# Patient Record
Sex: Male | Born: 1960 | Race: White | Hispanic: No | Marital: Single | Smoking: Never smoker
Health system: Southern US, Community
[De-identification: ages and names within clinical notes are randomized; demographics above are authoritative.]

## PROBLEM LIST (undated history)

## (undated) DIAGNOSIS — D45 Polycythemia vera: Secondary | ICD-10-CM

## (undated) DIAGNOSIS — G459 Transient cerebral ischemic attack, unspecified: Secondary | ICD-10-CM

## (undated) DIAGNOSIS — I10 Essential (primary) hypertension: Secondary | ICD-10-CM

## (undated) DIAGNOSIS — F32A Depression, unspecified: Secondary | ICD-10-CM

## (undated) DIAGNOSIS — I639 Cerebral infarction, unspecified: Secondary | ICD-10-CM

## (undated) DIAGNOSIS — M79606 Pain in leg, unspecified: Secondary | ICD-10-CM

## (undated) DIAGNOSIS — M21379 Foot drop, unspecified foot: Secondary | ICD-10-CM

## (undated) DIAGNOSIS — D689 Coagulation defect, unspecified: Secondary | ICD-10-CM

## (undated) DIAGNOSIS — E291 Testicular hypofunction: Secondary | ICD-10-CM

## (undated) DIAGNOSIS — K429 Umbilical hernia without obstruction or gangrene: Secondary | ICD-10-CM

## (undated) DIAGNOSIS — J111 Influenza due to unidentified influenza virus with other respiratory manifestations: Secondary | ICD-10-CM

## (undated) DIAGNOSIS — E785 Hyperlipidemia, unspecified: Secondary | ICD-10-CM

## (undated) DIAGNOSIS — D649 Anemia, unspecified: Secondary | ICD-10-CM

## (undated) DIAGNOSIS — K4090 Unilateral inguinal hernia, without obstruction or gangrene, not specified as recurrent: Secondary | ICD-10-CM

## (undated) DIAGNOSIS — Z9289 Personal history of other medical treatment: Secondary | ICD-10-CM

## (undated) DIAGNOSIS — M792 Neuralgia and neuritis, unspecified: Secondary | ICD-10-CM

## (undated) DIAGNOSIS — F329 Major depressive disorder, single episode, unspecified: Secondary | ICD-10-CM

## (undated) DIAGNOSIS — L723 Sebaceous cyst: Secondary | ICD-10-CM

## (undated) DIAGNOSIS — T7840XA Allergy, unspecified, initial encounter: Secondary | ICD-10-CM

## (undated) HISTORY — DX: Anemia, unspecified: D64.9

## (undated) HISTORY — PX: SPLENECTOMY, TOTAL: SHX788

## (undated) HISTORY — DX: Unilateral inguinal hernia, without obstruction or gangrene, not specified as recurrent: K40.90

## (undated) HISTORY — DX: Foot drop, unspecified foot: M21.379

## (undated) HISTORY — PX: FRACTURE SURGERY: SHX138

## (undated) HISTORY — DX: Essential (primary) hypertension: I10

## (undated) HISTORY — DX: Sebaceous cyst: L72.3

## (undated) HISTORY — DX: Allergy, unspecified, initial encounter: T78.40XA

## (undated) HISTORY — PX: SKIN CANCER EXCISION: SHX779

## (undated) HISTORY — DX: Pain in leg, unspecified: M79.606

## (undated) HISTORY — PX: COLON SURGERY: SHX602

## (undated) HISTORY — PX: EXCISIONAL HEMORRHOIDECTOMY: SHX1541

## (undated) HISTORY — DX: Hyperlipidemia, unspecified: E78.5

## (undated) HISTORY — DX: Coagulation defect, unspecified: D68.9

## (undated) HISTORY — PX: BACK SURGERY: SHX140

## (undated) HISTORY — DX: Umbilical hernia without obstruction or gangrene: K42.9

## (undated) HISTORY — PX: INGUINAL HERNIA REPAIR: SUR1180

## (undated) HISTORY — DX: Testicular hypofunction: E29.1

## (undated) HISTORY — DX: Influenza due to unidentified influenza virus with other respiratory manifestations: J11.1

## (undated) HISTORY — PX: FEMUR SURGERY: SHX943

## (undated) HISTORY — PX: COCCYX FRACTURE SURGERY: SHX599

---

## 1994-12-04 ENCOUNTER — Emergency Department: Admit: 1994-12-04 | Payer: Self-pay | Source: Ambulatory Visit | Admitting: Emergency Medicine

## 2011-01-31 ENCOUNTER — Inpatient Hospital Stay
Admission: EM | Admit: 2011-01-31 | Disposition: A | Discharge status: Home / Self Care | Payer: Self-pay | Source: Emergency Department | Attending: Internal Medicine | Admitting: Internal Medicine

## 2011-01-31 LAB — CBC AND DIFFERENTIAL
Hematocrit: 56.2 % — ABNORMAL HIGH (ref 42.0–52.0)
Hgb: 18.7 g/dL — ABNORMAL HIGH (ref 13.0–17.0)
MCH: 27.1 pg — ABNORMAL LOW (ref 28.0–32.0)
MCHC: 33.3 g/dL (ref 32.0–36.0)
MCV: 81.6 fL (ref 80.0–100.0)
MPV: 10.1 fL (ref 9.4–12.3)
Platelets: 339 10*3/uL (ref 140–400)
RBC: 6.89 10*6/uL — ABNORMAL HIGH (ref 4.70–6.00)
RDW: 17 % — ABNORMAL HIGH (ref 12–15)
WBC: 10.07 10*3/uL (ref 3.50–10.80)

## 2011-01-31 LAB — COMPREHENSIVE METABOLIC PANEL
ALT: 19 U/L (ref 0–55)
AST (SGOT): 22 U/L (ref 5–34)
Albumin/Globulin Ratio: 1.2 (ref 0.9–2.2)
Albumin: 4.1 g/dL (ref 3.5–5.0)
Alkaline Phosphatase: 112 U/L (ref 40–150)
Anion Gap: 13 (ref 5.0–15.0)
BUN: 18 mg/dL (ref 9.0–21.0)
Bilirubin, Total: 0.9 mg/dL (ref 0.2–1.2)
CO2: 21 mEq/L — ABNORMAL LOW (ref 22–29)
Calcium: 9.1 mg/dL (ref 8.5–10.5)
Chloride: 108 mEq/L — ABNORMAL HIGH (ref 98–107)
Creatinine: 1 mg/dL (ref 0.7–1.3)
Globulin: 3.5 g/dL (ref 2.0–3.6)
Glucose: 114 mg/dL — ABNORMAL HIGH (ref 70–100)
Potassium: 3.9 mEq/L (ref 3.5–5.1)
Protein, Total: 7.6 g/dL (ref 6.0–8.3)
Sodium: 142 mEq/L (ref 136–145)

## 2011-01-31 LAB — TROPONIN I: Troponin I: <0.01 ng/mL (ref 0.00–0.09)

## 2011-01-31 LAB — MAN DIFF ONLY
Band Neutrophils Absolute: 0 10*3/uL (ref 0.00–1.00)
Band Neutrophils: 0 % (ref 0–9)
Basophils Absolute Manual: 0.1 10*3/uL (ref 0.00–0.20)
Basophils Manual: 1 % (ref 0–2)
Eosinophils Absolute Manual: 0.1 10*3/uL (ref 0.00–0.70)
Eosinophils Manual: 1 % (ref 0–5)
Lymphocytes Absolute Manual: 1.38 10*3/uL (ref 0.50–4.40)
Lymphocytes Manual: 14 % — ABNORMAL LOW (ref 15–41)
Monocytes Absolute: 0.98 10*3/uL (ref 0.00–1.20)
Monocytes Manual: 10 % (ref 1–11)
Neutrophils Absolute Manual: 7.28 10*3/uL (ref 1.80–8.10)
Nucleated RBC: 0 /100 WBC
Segmented Neutrophils: 74 % (ref 52–75)

## 2011-01-31 LAB — HEMOLYSIS INDEX: Hemolysis Index: 25 Index — ABNORMAL HIGH (ref 0–18)

## 2011-01-31 LAB — PT/INR
PT INR: 1 (ref 0.9–1.1)
PT: 13.2 s (ref 12.6–15.0)

## 2011-01-31 LAB — CELL MORPHOLOGY: Cell Morphology: NORMAL

## 2011-01-31 LAB — GFR: EGFR: >60

## 2011-01-31 LAB — APTT: PTT: 29 s (ref 23–37)

## 2011-02-01 LAB — CBC AND DIFFERENTIAL
Basophils Absolute Automated: 0.06 10*3/uL (ref 0.00–0.20)
Basophils Automated: 0 % (ref 0–2)
Eosinophils Absolute Automated: 0.05 10*3/uL (ref 0.00–0.70)
Eosinophils Automated: 0 % (ref 0–5)
Hematocrit: 52.3 % — ABNORMAL HIGH (ref 42.0–52.0)
Hgb: 17.3 g/dL — ABNORMAL HIGH (ref 13.0–17.0)
Immature Granulocytes Absolute: 0.03 10*3/uL
Immature Granulocytes: 0 % (ref 0–1)
Lymphocytes Absolute Automated: 1.22 10*3/uL (ref 0.50–4.40)
Lymphocytes Automated: 11 % — ABNORMAL LOW (ref 15–41)
MCH: 27.2 pg — ABNORMAL LOW (ref 28.0–32.0)
MCHC: 33.1 g/dL (ref 32.0–36.0)
MCV: 82.4 fL (ref 80.0–100.0)
MPV: 10.6 fL (ref 9.4–12.3)
Monocytes Absolute Automated: 0.86 10*3/uL (ref 0.00–1.20)
Monocytes: 8 % (ref 0–11)
Neutrophils Absolute: 8.94 10*3/uL — ABNORMAL HIGH (ref 1.80–8.10)
Neutrophils: 80 % — ABNORMAL HIGH (ref 52–75)
Nucleated RBC: 0 /100 WBC
Platelets: 300 10*3/uL (ref 140–400)
RBC: 6.35 10*6/uL — ABNORMAL HIGH (ref 4.70–6.00)
RDW: 16 % — ABNORMAL HIGH (ref 12–15)
WBC: 11.16 10*3/uL — ABNORMAL HIGH (ref 3.50–10.80)

## 2011-02-01 LAB — LIPID PANEL
Cholesterol / HDL Ratio: 4.9 Index
Cholesterol: 188 mg/dL (ref 0–199)
HDL: 38 mg/dL — ABNORMAL LOW (ref >40)
LDL Calculated: 133 mg/dL — ABNORMAL HIGH (ref 0–99)
Triglycerides: 83 mg/dL (ref 34–149)
VLDL Calculated: 17 mg/dL (ref 10–40)

## 2011-02-01 LAB — ECG 12-LEAD
Atrial Rate: 73 {beats}/min
P Axis: 65 degrees
P-R Interval: 186 ms
Q-T Interval: 380 ms
QRS Duration: 92 ms
QTC Calculation (Bezet): 418 ms
R Axis: -33 degrees
T Axis: 46 degrees
Ventricular Rate: 73 {beats}/min

## 2011-02-01 LAB — HOMOCYSTEINE, SERUM: HOMOCYSTEINE: 7.33 umol/L (ref 5.08–15.39)

## 2011-02-01 LAB — VITAMIN B12: Vitamin B-12: 707 pg/mL (ref 211–911)

## 2011-02-01 LAB — HEMOLYSIS INDEX: Hemolysis Index: 118 Index — ABNORMAL HIGH (ref 0–9)

## 2011-02-01 LAB — TSH: TSH: 1.13 u[IU]/mL (ref 0.35–4.94)

## 2011-02-01 LAB — SEDIMENTATION RATE: Sed Rate: 2 mm/Hr (ref 0–15)

## 2011-02-01 LAB — FOLATE: Folate: >24 ng/mL

## 2011-02-01 NOTE — Consults (Signed)
Timothy Brandt, SATTLER                                           MRN:          [id number]                                                          Account:      [id number]                                        Document ID:  [id number]                                                   Service Date: 02/01/2011                                                                                    Admit Date: 01/31/2011     Patient Location: [id number]  Patient Type: I     CONSULTING PHYSICIAN: Ilda Basset MD     REFERRING PHYSICIAN: Doree Albee Silis MD        CONSULTING SERVICES:  Neurology.     REASON FOR CONSULTATION:  Episodes of vertigo.     HISTORY OF PRESENT ILLNESS:  This is a 51 year old male with history of hypertension, hyperlipidemia,  recent weight loss history, presented to the emergency room with episode of  dizziness with a spinning sensation while he was having dinner yesterday  evening around 9:00 p.m.  The patient reported that around the dinnertime,  he suddenly felt uneasy, discomfort and lightheadedness with a spinning  sensation.  Then his head started hurting and then suddenly he felt that he  is not going to keep his food and he started throwing up, and he threw up  all his food that he ate.  He also mentioned that his symptoms did not go  away, and then EMS was called and he was brought here for further  evaluation.  At that time, his blood pressure was 195/118 and received  treatment for hypertension and is not on any medicine at home.  The patient  reported that he was told that he has a history of hypertension, but never  on medication.  He also found to have hyperlipidemia, but not on medicine  as well.  At the time of my evaluation, the patient at his baseline.  He  reported this symptom lasted almost 2 hours, did not return, and at his  baseline at this time, as mentioned.     PAST MEDICAL HISTORY:  Hypertension, hyperlipidemia.     PAST SURGICAL HISTORY:  Inguinal hernia  repair.     FAMILY  HISTORY:  Unremarkable except strong family history of heart disease and  hypertension, especially father's side.     ALLERGIES:                                                                                                           Page 1 of 3  JKWON, TREPTOW                                           MRN:          [id number]                                                          Account:      [id number]                                        Document ID:  [id number]                                                   Service Date: 02/01/2011                                                                                    Not allergic to any medications.     MEDICATIONS AT HOME:  Not on any medicine at home.     SOCIAL HISTORY:  He works in a Materials engineer, Development worker, international [employer].  Denies smoking cigarettes.   Very occasional alcohol.  No illicit drugs.     REVIEW OF SYSTEMS:  A 10-point review of systems unremarkable.  No chest pain, no shortness of  breath, no incontinence of bladder or bowel.  Otherwise unremarkable.     PHYSICAL EXAMINATION:  VITAL SIGNS:  Blood pressure 147/90, heart rate 75, respirations 18,  temperature 98.5, 95% room air.  GENERAL:  Pleasant male, well developed, well nourished, not in acute  distress.  HEENT:  Supple, no carotid bruit or thyromegaly.  HEART:  Regular rate and rhythm, no murmur, no gallop.  LUNGS:  Clear to auscultation, no rales, rhonchi.  ABDOMEN:  Soft, nontender, nondistended.  EXTREMITIES:  No clubbing, no cyanosis, no edema.  Pulses are present  bilaterally.  NEUROLOGIC:  Mental status:  Alert, oriented x3, appropriate, cooperative,  follows commands.  Speech is normal with good comprehension.  Attention and  concentration intact.  Memory also intact.  Cranial nerves:  Pupils are  3-mm, round, reactive bilaterally.  Extraocular movements all are intact.  No facial asymmetry.  Visual fields also intact bilaterally.  No nystagmus  bilaterally.  All other cranial  nerves are intact.  Motor examination:  Strength 5/5 upper and lower extremities bilaterally.  Normal muscle tone  and bulk of muscle.  Sensation intact to upper and lower extremities  bilaterally.  Coordination intact finger-to-nose, heel-to-shin.  Gait:  Stands easily, toe walking, heel walking, tandem walking.  Myotonic  reflexes 2+ symmetric throughout.  Toes are downgoing bilaterally.     LABORATORY AND DIAGNOSTIC DATA:  CBC:  White blood count 10.07, hemoglobin 18.7, platelets 339.  Basic  metabolic panel:  Sodium 142, potassium 3.9, BUN 18, creatinine 1.0,  glucose 114.  He had a head CT without contrast which did not show any  acute intracranial process at the time of admission.     IMPRESSION:  The patient is a 51 year old male who presented with resolved symptom of  vertigo, and now under workup for stroke, especially with                                                                                                            Page 2 of 3  KHAN, CHURA                                           MRN:          [id number]                                                          Account:      [id number]                                        Document ID:  [id number]                                                   Service Date: 02/01/2011  acute cerebellar stroke.     RECOMMENDATION:  1.  Continue aspirin 81 mg daily, and statin  2.  Workup with MRI of head as well as MRA headand neck  3.  Stroke labs with homocysteine, folate, B12, hemoglobin A1c, lipid panel,   TSH, sedimentation rate, and RPR with full list of stroke workup. 4. After MRI   of head will consider hypercoagulable state workup if MRI show stroke.   5. Recommend to havephysical and occupational therapy.   6. Continue Meclizinelow dose s he has been receiving with vertigo symptoms.     The patient also recommended to follow up with his primary team here.        The patient will be following up with neurology service.     Thank you for allowing me to participate in the care of this patient.                 D:  02/01/2011 13:55 PM by Dr. Ilda Basset, MD ([id number])  T:  02/01/2011 14:19 PM by [id number]        cc:                                                                                                           Page 3 of 3  Authenticated and Edited by Ilda Basset, MD ([id number]) On 02/01/11 5:55:54 PM

## 2011-02-02 DIAGNOSIS — R42 Dizziness and giddiness: Secondary | ICD-10-CM

## 2011-02-02 DIAGNOSIS — G458 Other transient cerebral ischemic attacks and related syndromes: Secondary | ICD-10-CM

## 2011-02-02 LAB — CBC AND DIFFERENTIAL
Basophils Absolute Automated: 0.06 10*3/uL (ref 0.00–0.20)
Basophils Automated: 1 % (ref 0–2)
Eosinophils Absolute Automated: 0.12 10*3/uL (ref 0.00–0.70)
Eosinophils Automated: 1 % (ref 0–5)
Hematocrit: 57.1 % — ABNORMAL HIGH (ref 42.0–52.0)
Hgb: 18.8 g/dL — ABNORMAL HIGH (ref 13.0–17.0)
Immature Granulocytes Absolute: 0.03 10*3/uL
Immature Granulocytes: 0 % (ref 0–1)
Lymphocytes Absolute Automated: 1.44 10*3/uL (ref 0.50–4.40)
Lymphocytes Automated: 14 % — ABNORMAL LOW (ref 15–41)
MCH: 26.7 pg — ABNORMAL LOW (ref 28.0–32.0)
MCHC: 32.9 g/dL (ref 32.0–36.0)
MCV: 81.2 fL (ref 80.0–100.0)
MPV: 9.5 fL (ref 9.4–12.3)
Monocytes Absolute Automated: 0.89 10*3/uL (ref 0.00–1.20)
Monocytes: 9 % (ref 0–11)
Neutrophils Absolute: 7.62 10*3/uL (ref 1.80–8.10)
Neutrophils: 75 % (ref 52–75)
Nucleated RBC: 0 /100 WBC
Platelets: 282 10*3/uL (ref 140–400)
RBC: 7.03 10*6/uL — ABNORMAL HIGH (ref 4.70–6.00)
RDW: 17 % — ABNORMAL HIGH (ref 12–15)
WBC: 10.13 10*3/uL (ref 3.50–10.80)

## 2011-02-02 LAB — HEMOGLOBIN A1C: Hemoglobin A1C: 5.4 % (ref 0.0–6.0)

## 2011-02-02 LAB — RPR: RPR: NONREACTIVE

## 2011-02-02 NOTE — H&P (Signed)
Timothy Brandt, Timothy Brandt                                        MRN:          [id number]                                                          Account:      [id number]                                        Document ID:  [id number]                                                                                                                                        Admit Date: 01/31/2011     Patient Location: U7253-66  Patient Type: I     ATTENDING PHYSICIAN: Hoy Finlay, MD        HISTORY OF PRESENT ILLNESS:  This is a 51 year old male patient who presents to the ER complaining of  vertigo.  The patient denies any headache, chest pain, shortness of breath,  palpitations, no fever, no blurry vision.  The patient's symptoms came on  abruptly.  The patient was noted to have an elevated blood pressure in the  ER.  The patient admitted to [facility] for further  evaluation.     PAST MEDICAL HISTORY:  Hyperlipidemia and inguinal hernia.     SOCIAL HISTORY:  Negative tobacco, occasional ETOH.     ALLERGIES:  None.     FAMILY HISTORY:  Father had a MI at age 59.     RADIOLOGICAL DATA:  Chest x-ray is negative.     A CT of the head is negative.     LABORATORY DATA:  White count of 10, hematocrit 56, platelets of 339.  Sodium of 142,  potassium 2.9, BUN of 18, creatinine 1.0.     PHYSICAL EXAMINATION:  VITAL SIGNS:  Temperature is 98.5, blood pressure 147/90, pulse is 70,  respirations 18.  GENERAL:  The patient is awake, alert, nontoxic.  HEENT:  Head atraumatic.  Eyes anicteric.  NECK:  Negative JVD.  CARDIOVASCULAR:  S1, S2.  LUNGS:  Bilateral breath sounds.  Negative for wheezing or rales.  Page 1 of 2  Timothy Brandt, Timothy Brandt                                        MRN:          [id number]                                                          Account:      [id number]                                        Document  ID:  [id number]                                                                                                                                        ABDOMEN:  Nondistended.  EXTREMITIES:  Negative for peripheral edema.  NEUROLOGIC:  Patient is alert and nonfocal.     PROBLEMS:  1.  Vertigo.  The patient admitted to [facility].  A CT  reviewed was negative.  Neurology consultation obtained.  We will obtain  MRI and MRA.  2.  Hypertension.  We will start the patient on Norvasc 5 and Diovan 160.  Obtain a 2-dimensional echocardiogram.  3.  We will also a obtain lipid panel.  4.  Rule out polycythemia.  We will repeat a CBC and obtain a  hematology/oncology consultation.     Await further recommendations from specialists and above studies.           Electronic Signing Provider     D:  02/01/2011 13:13 PM by Dr. Doree Albee. Zikeria Keough, MD [phone])  T:  02/01/2011 14:19 PM by [id number]        cc:                                                                                                           Page 2 of 2  Authenticated by Doree Albee. Vonzell Lindblad, MD ([id number]) On 02/02/2011 08:08:10 AM

## 2011-02-03 NOTE — Discharge Summary (Signed)
Timothy Brandt, Timothy Brandt                                        MRN:          [id number]                                                          Account:      [id number]                                        Document ID:  [id number]                                                                                                                                        Admit Date: 01/31/2011  Discharge Date:     ATTENDING PHYSICIAN:  Hoy Finlay, MD        HISTORY OF PRESENT ILLNESS:  Please see history and physical.  This is a 51 year old male admitted with  vertigo and elevated blood pressure.     HOSPITAL COURSE:  The patient initially given IV hydralazine in ED.  The patient's review  blood pressure was 147/90, patient was started on Diovan 160 mg, Norvasc 5.   CT of the head negative.  The patient's hematocrit was 56 on admission.  BUN of 18, creatinine 1.0.  Troponin negative x3.  The patient evaluated by  neurology and hematology.  The patient's MRI of the head was negative.  The  patient's MRA of head and neck were negative.  A 2D echocardiogram was  pending at time of discharge.  The patient's total cholesterol 198, HDL 30  and LDL of 133.  The patient was started on Crestor 5.  The patient on  February 02, 2011 felt well.  No further vertigo.  The patient's blood  pressure was 135/79, pulse 72.  The patient was clinically stable on  Norvasc 5, Diovan 160 every day, Crestor 5.  The patient instructed to  follow up with hematology-oncology, Dr. Bing Matter for outpatient evaluation to  rule out polycythemia vera for further treatment.  The patient to follow up  with primary care physician.  The patient obtained copy of chart and also  reviewed official result of echocardiogram with primary care physician.  The patient was clinically stable on Crestor 5, Diovan 160 and Norvasc 5.     DISCHARGE MEDICATIONS:  Please see medicine reconciliation for additional discharge medications.           Electronic Signing Provider  D:   02/02/2011 14:39 PM by Dr. Doree Albee. Liban Guedes, MD [phone])  T:  02/02/2011 15:30 PM by [id number]           cc:                                                                                                           Page 1 of 1  Authenticated by Doree Albee. Nitara Szczerba, MD ([id number]) On 02/03/2011 02:04:55 PM

## 2011-02-03 NOTE — Consults (Addendum)
Timothy Brandt, Timothy Brandt                                          MRN:          [id number]                                                          Account:      [id number]                                        Document ID:  [id number]                                                   Service Date: 02/01/2011                                                                                    Admit Date: 01/31/2011     Patient Location: [id number]  Patient Type: I     CONSULTING PHYSICIAN: Kem Boroughs MD     REFERRING PHYSICIAN:        REASON FOR CONSULTATION:  Elevated hemoglobin.     HISTORY OF PRESENT ILLNESS:  The patient is a 51 year old pleasant male with a past medical history  significant only for borderline hypertension.  He does state that in the  past he has been told he had an elevated hemoglobin at that time, it was  felt to be due to smoking; however, the patient reports no smoking history.   He presented to the emergency room via EMS from the restaurant where he  was eating.  Prior to going to [employer], he was feeling fine during  the time in the restaurant, he developed symptoms of severe vertigo.  Associated symptoms were emesis, weakness of extremities.  He denies any  visual changes or change in speech, or loss of consciousness.  His  hemoglobin was found to be 18.7 and hematocrit 56.2.     LABORATORY DATA:  White blood cells were 10 and 0.7, platelets 339.  His liver functions were  completely normal with a bilirubin of 0.9, alkaline phosphatase 112, ALT  19, AST 22.  Coagulations:  PT and APTT were normal at 13.2 and 29  respectively.  Kidney function was normal, creatinine of 1.0.  His total  protein 7.6.     PAST MEDICAL HISTORY:  Borderline hypertension and possible elevated hemoglobin in the past.     PAST SURGICAL HISTORY:  Inguinal hernia repair.     SOCIAL HISTORY:  Negative for tobacco and alcohol.  He works full-time in Control and instrumentation engineer.     FAMILY HISTORY:  Negative for blood cell  dyscrasias or malignancies.     ALLERGIES:                                                                                                           Page 1 of 2  Timothy Brandt, Timothy Brandt                                          MRN:          [id number]                                                          Account:      [id number]                                        Document ID:  [id number]                                                   Service Date: 02/01/2011                                                                                    No known drug allergies.     MEDICATIONS:  Norvasc and severe Diovan, hydralazine, Ativan, Zofran.     PHYSICAL EXAMINATION:  GENERAL:  Alert, oriented, in no acute distress.  VITAL SIGNS:  Temperature 96.7, pulse 77, respirations 18, blood pressure  144/88.  HEENT:  Oropharynx moist.  NECK:  Supple.  There is no cervical, supraclavicular or axillary  adenopathy palpable bilaterally.  LUNGS:  Clear to auscultation.  HEART:  With regular rate and rhythm.  ABDOMEN:  Soft, nontender, no hepatosplenomegaly.  EXTREMITIES:  Without clubbing, cyanosis or edema.  SKIN:  Without rash.     LABORATORY AND DIAGNOSTIC DATA:  CBC as above.  Hemoglobin 18.7, hematocrit 56.2, and as above.  Pending  labs include homocysteine, vitamin B12, hemoglobin A1c, RPR, folate, TSH,  sedimentation rate, and lipid panel.     ASSESSMENT AND RECOMMENDATIONS:  This is a 51 year old gentleman presenting with elevated hemoglobin after  isolated event of vertigo with extremity weakness.  He has been seen by  neurology.  Neurology workup is  in process including an MRA as well as labs  as ordered by neurology or stated above.  We would like to rule out  polycythemia vera at this time; however, we will wait for CBC results from  tomorrow, to be certain there is no hemoconcentration.  If his hemoglobin  is still high, we will send [name]2 studies.  More recommendations to follow.     The patient was seen in  collaboration with Dr. Blake Divine.           Electronic Signing Provider     D:  02/01/2011 16:08 PM by Ms. Sheryle Spray. [place], NP [phone])  T:  02/01/2011 17:25 PM by [id number]        cc:                                                                                                           Page 2 of 2  Authenticated by Marinus Maw, NP [phone]) On 02/03/2011 10:43:34 AM   Authenticated by Gunnar Fusi, MD ([id number]) On 02/05/2011 01:21:46 PM

## 2011-02-05 LAB — JAK2 V617F MUTANT QUAL RFL EXONS 12&13: JAK2 V617F: DETECTED

## 2011-02-05 NOTE — Procedures (Signed)
Timothy, Brandt                                        MRN:          [id number]                                                          Account:      [id number]                                        Document ID:  [id number]                                                   Service Date: 02/01/2011                                                                                    Admit Date: 01/31/2011  Order Number:     Patient Location: A2810-01  Patient Type: I     PHYSICIAN/PROVIDER: Alesia Banda MD     REFERRING PHYSICIAN:        INDICATION FOR THE STUDY:  Hypertensive heart disease.     M-MODE MEASUREMENTS:  LEFT ATRIUM:  37 mm.  AORTIC ROOT:  31 mm.  RVD:  23 mm.  LVS:  35 mm.  LVD: 53 mm.  IVSD:  9 mm.  LVPWD:  9 mm.  SHORTENING FRACTION: 32%.     DOPPLER MEASUREMENTS:  PEAK VELOCITIES:  MITRAL VALVE:  0.9 m/s.  AORTIC VALVE:  1.6 m/s.  PULMONIC VALVE:  1.3 m/s.  TRICUSPID VALVE:  0.9 m/s.     INTERPRETATION:  1.  Technically adequate study.  2.  The left ventricle is normal in size with normal systolic function.  The estimated left ventricular ejection fraction is 60% to 65%.  There are  no focal wall motion abnormalities.  3.  The right ventricle is normal in size with normal systolic function.  4.  The atria are of normal size.  The interatrial septum is intact.  There  is no obvious evidence of intracavitary thrombi or mass lesions.  5.  The mitral valve is structurally normal with trivial mitral  insufficiency.  6.  The aortic valve is tricuspid with trivial aortic insufficiency.  7.  The tricuspid valve is structurally normal with trivial to mild  tricuspid insufficiency.  8.  The pulmonic valve is structurally normal with mild pulmonic  Page 1 of 2  Timothy, Brandt                                        MRN:          [id number]                                                          Account:       [id number]                                        Document ID:  [id number]                                                   Service Date: 02/01/2011                                                                                    insufficiency.  9.  The Doppler derived PA systolic pressure is 43 mmHg.  This is  consistent with mild pulmonary hypertension.  10.  There is no pericardial effusion.     CONCLUSIONS:  1.  Preserved left ventricular ejection fraction estimated to be 60% to  65%.  2.  Trivial mitral, aortic, and tricuspid insufficiency.  3.  Mild pulmonic insufficiency.  4.  Mild pulmonary hypertension.           Electronic Signing Provider     D:  02/02/2011 14:23 PM by Dr. Angelique Blonder L. Claybon Jabs, MD [phone])  T:  02/02/2011 15:11 PM by [id number]        cc:                                                                                                           Page 2 of 2  Authenticated by Marykay Lex. Claybon Jabs, MD ([id number]) On 02/05/2011 02:14:18 PM

## 2012-01-19 HISTORY — PX: FOOT SURGERY: SHX648

## 2012-05-02 ENCOUNTER — Ambulatory Visit (INDEPENDENT_AMBULATORY_CARE_PROVIDER_SITE_OTHER): Payer: BC Managed Care – PPO | Admitting: Emergency Medicine

## 2012-05-02 VITALS — BP 162/104 | HR 73 | Temp 97.6°F | Resp 18 | Ht 72.5 in | Wt 200.0 lb

## 2012-05-02 DIAGNOSIS — E782 Mixed hyperlipidemia: Secondary | ICD-10-CM

## 2012-05-02 DIAGNOSIS — I10 Essential (primary) hypertension: Secondary | ICD-10-CM

## 2012-05-02 DIAGNOSIS — L409 Psoriasis, unspecified: Secondary | ICD-10-CM

## 2012-05-02 DIAGNOSIS — K4090 Unilateral inguinal hernia, without obstruction or gangrene, not specified as recurrent: Secondary | ICD-10-CM

## 2012-05-02 DIAGNOSIS — L408 Other psoriasis: Secondary | ICD-10-CM

## 2012-05-02 LAB — POCT CBC
Granulocyte percent: 82.7 %G — AB (ref 37–80)
Hemoglobin: 18.3 g/dL — AB (ref 14.1–18.1)
MCV: 76.7 fL — AB (ref 80–97)
MID (cbc): 0.5 (ref 0–0.9)

## 2012-05-02 LAB — COMPREHENSIVE METABOLIC PANEL
Albumin: 4.7 g/dL (ref 3.5–5.2)
CO2: 28 mEq/L (ref 19–32)
Calcium: 10 mg/dL (ref 8.4–10.5)
Glucose, Bld: 56 mg/dL — ABNORMAL LOW (ref 70–99)
Potassium: 4.2 mEq/L (ref 3.5–5.3)
Sodium: 141 mEq/L (ref 135–145)
Total Protein: 8.1 g/dL (ref 6.0–8.3)

## 2012-05-02 LAB — POCT UA - MICROSCOPIC ONLY
Crystals, Ur, HPF, POC: NEGATIVE
Mucus, UA: NEGATIVE

## 2012-05-02 LAB — POCT URINALYSIS DIPSTICK
Glucose, UA: NEGATIVE
Ketones, UA: NEGATIVE
Spec Grav, UA: 1.025

## 2012-05-02 LAB — CBC WITH DIFFERENTIAL/PLATELET
Basophils Absolute: 0.1 10*3/uL (ref 0.0–0.1)
Basophils Relative: 1 % (ref 0–1)
MCHC: 33.5 g/dL (ref 30.0–36.0)
Neutro Abs: 7.5 10*3/uL (ref 1.7–7.7)
Neutrophils Relative %: 78 % — ABNORMAL HIGH (ref 43–77)
RDW: 18.9 % — ABNORMAL HIGH (ref 11.5–15.5)

## 2012-05-02 LAB — LIPID PANEL
Cholesterol: 228 mg/dL — ABNORMAL HIGH (ref 0–200)
Triglycerides: 175 mg/dL — ABNORMAL HIGH (ref <150)

## 2012-05-02 MED ORDER — LISINOPRIL 20 MG PO TABS: 20.0000 mg | ORAL_TABLET | Freq: Every day | ORAL | Status: DC

## 2012-05-02 NOTE — Patient Instructions (Addendum)
Hypertriglyceridemia  Diet for High blood levels of Triglycerides Most fats in food are triglycerides. Triglycerides in your blood are stored as fat in your body. High levels of triglycerides in your blood may put you at a greater risk for heart disease and stroke.  Normal triglyceride levels are less than 150 mg/dL. Borderline high levels are 150-199 mg/dl. High levels are 200 - 499 mg/dL, and very high triglyceride levels are greater than 500 mg/dL. The decision to treat high triglycerides is generally based on the level. For people with borderline or high triglyceride levels, treatment includes weight loss and exercise. Drugs are recommended for people with very high triglyceride levels. Many people who need treatment for high triglyceride levels have metabolic syndrome. This syndrome is a collection of disorders that often include: insulin resistance, high blood pressure, blood clotting problems, high cholesterol and triglycerides. TESTING PROCEDURE FOR TRIGLYCERIDES  You should not eat 4 hours before getting your triglycerides measured. The normal range of triglycerides is between 10 and 250 milligrams per deciliter (mg/dl). Some people may have extreme levels (1000 or above), but your triglyceride level may be too high if it is above 150 mg/dl, depending on what other risk factors you have for heart disease.  People with high blood triglycerides may also have high blood cholesterol levels. If you have high blood cholesterol as well as high blood triglycerides, your risk for heart disease is probably greater than if you only had high triglycerides. High blood cholesterol is one of the main risk factors for heart disease. CHANGING YOUR DIET  Your weight can affect your blood triglyceride level. If you are more than 20% above your ideal body weight, you may be able to lower your blood triglycerides by losing weight. Eating less and exercising regularly is the best way to combat this. Fat provides more  calories than any other food. The best way to lose weight is to eat less fat. Only 30% of your total calories should come from fat. Less than 7% of your diet should come from saturated fat. A diet low in fat and saturated fat is the same as a diet to decrease blood cholesterol. By eating a diet lower in fat, you may lose weight, lower your blood cholesterol, and lower your blood triglyceride level.  Eating a diet low in fat, especially saturated fat, may also help you lower your blood triglyceride level. Ask your dietitian to help you figure how much fat you can eat based on the number of calories your caregiver has prescribed for you.  Exercise, in addition to helping with weight loss may also help lower triglyceride levels.   Alcohol can increase blood triglycerides. You may need to stop drinking alcoholic beverages.  Too much carbohydrate in your diet may also increase your blood triglycerides. Some complex carbohydrates are necessary in your diet. These may include bread, rice, potatoes, other starchy vegetables and cereals.  Reduce "simple" carbohydrates. These may include pure sugars, candy, honey, and jelly without losing other nutrients. If you have the kind of high blood triglycerides that is affected by the amount of carbohydrates in your diet, you will need to eat less sugar and less high-sugar foods. Your caregiver can help you with this.  Adding 2-4 grams of fish oil (EPA+ DHA) may also help lower triglycerides. Speak with your caregiver before adding any supplements to your regimen. Following the Diet  Maintain your ideal weight. Your caregivers can help you with a diet. Generally, eating less food and getting more   exercise will help you lose weight. Joining a weight control group may also help. Ask your caregivers for a good weight control group in your area.  Eat low-fat foods instead of high-fat foods. This can help you lose weight too.  These foods are lower in fat. Eat MORE of these:    Dried beans, peas, and lentils.  Egg whites.  Low-fat cottage cheese.  Fish.  Lean cuts of meat, such as round, sirloin, rump, and flank (cut extra fat off meat you fix).  Whole grain breads, cereals and pasta.  Skim and nonfat dry milk.  Low-fat yogurt.  Poultry without the skin.  Cheese made with skim or part-skim milk, such as mozzarella, parmesan, farmers', ricotta, or pot cheese. These are higher fat foods. Eat LESS of these:   Whole milk and foods made from whole milk, such as American, blue, cheddar, monterey jack, and swiss cheese  High-fat meats, such as luncheon meats, sausages, knockwurst, bratwurst, hot dogs, ribs, corned beef, ground pork, and regular ground beef.  Fried foods. Limit saturated fats in your diet. Substituting unsaturated fat for saturated fat may decrease your blood triglyceride level. You will need to read package labels to know which products contain saturated fats.  These foods are high in saturated fat. Eat LESS of these:   Fried pork skins.  Whole milk.  Skin and fat from poultry.  Palm oil.  Butter.  Shortening.  Cream cheese.  Bacon.  Margarines and baked goods made from listed oils.  Vegetable shortenings.  Chitterlings.  Fat from meats.  Coconut oil.  Palm kernel oil.  Lard.  Cream.  Sour cream.  Fatback.  Coffee whiteners and non-dairy creamers made with these oils.  Cheese made from whole milk. Use unsaturated fats (both polyunsaturated and monounsaturated) moderately. Remember, even though unsaturated fats are better than saturated fats; you still want a diet low in total fat.  These foods are high in unsaturated fat:   Canola oil.  Sunflower oil.  Mayonnaise.  Almonds.  Peanuts.  Pine nuts.  Margarines made with these oils.  Safflower oil.  Olive oil.  Avocados.  Cashews.  Peanut butter.  Sunflower seeds.  Soybean oil.  Peanut  oil.  Olives.  Pecans.  Walnuts.  Pumpkin seeds. Avoid sugar and other high-sugar foods. This will decrease carbohydrates without decreasing other nutrients. Sugar in your food goes rapidly to your blood. When there is excess sugar in your blood, your liver may use it to make more triglycerides. Sugar also contains calories without other important nutrients.  Eat LESS of these:   Sugar, brown sugar, powdered sugar, jam, jelly, preserves, honey, syrup, molasses, pies, candy, cakes, cookies, frosting, pastries, colas, soft drinks, punches, fruit drinks, and regular gelatin.  Avoid alcohol. Alcohol, even more than sugar, may increase blood triglycerides. In addition, alcohol is high in calories and low in nutrients. Ask for sparkling water, or a diet soft drink instead of an alcoholic beverage. Suggestions for planning and preparing meals   Bake, broil, grill or roast meats instead of frying.  Remove fat from meats and skin from poultry before cooking.  Add spices, herbs, lemon juice or vinegar to vegetables instead of salt, rich sauces or gravies.  Use a non-stick skillet without fat or use no-stick sprays.  Cool and refrigerate stews and broth. Then remove the hardened fat floating on the surface before serving.  Refrigerate meat drippings and skim off fat to make low-fat gravies.  Serve more fish.  Use less butter,   margarine and other high-fat spreads on bread or vegetables.  Use skim or reconstituted non-fat dry milk for cooking.  Cook with low-fat cheeses.  Substitute low-fat yogurt or cottage cheese for all or part of the sour cream in recipes for sauces, dips or congealed salads.  Use half yogurt/half mayonnaise in salad recipes.  Substitute evaporated skim milk for cream. Evaporated skim milk or reconstituted non-fat dry milk can be whipped and substituted for whipped cream in certain recipes.  Choose fresh fruits for dessert instead of high-fat foods such as pies or  cakes. Fruits are naturally low in fat. When Dining Out   Order low-fat appetizers such as fruit or vegetable juice, pasta with vegetables or tomato sauce.  Select clear, rather than cream soups.  Ask that dressings and gravies be served on the side. Then use less of them.  Order foods that are baked, broiled, poached, steamed, stir-fried, or roasted.  Ask for margarine instead of butter, and use only a small amount.  Drink sparkling water, unsweetened tea or coffee, or diet soft drinks instead of alcohol or other sweet beverages. QUESTIONS AND ANSWERS ABOUT OTHER FATS IN THE BLOOD: SATURATED FAT, TRANS FAT, AND CHOLESTEROL What is trans fat? Trans fat is a type of fat that is formed when vegetable oil is hardened through a process called hydrogenation. This process helps makes foods more solid, gives them shape, and prolongs their shelf life. Trans fats are also called hydrogenated or partially hydrogenated oils.  What do saturated fat, trans fat, and cholesterol in foods have to do with heart disease? Saturated fat, trans fat, and cholesterol in the diet all raise the level of LDL "bad" cholesterol in the blood. The higher the LDL cholesterol, the greater the risk for coronary heart disease (CHD). Saturated fat and trans fat raise LDL similarly.  What foods contain saturated fat, trans fat, and cholesterol? High amounts of saturated fat are found in animal products, such as fatty cuts of meat, chicken skin, and full-fat dairy products like butter, whole milk, cream, and cheese, and in tropical vegetable oils such as palm, palm kernel, and coconut oil. Trans fat is found in some of the same foods as saturated fat, such as vegetable shortening, some margarines (especially hard or stick margarine), crackers, cookies, baked goods, fried foods, salad dressings, and other processed foods made with partially hydrogenated vegetable oils. Small amounts of trans fat also occur naturally in some animal  products, such as milk products, beef, and lamb. Foods high in cholesterol include liver, other organ meats, egg yolks, shrimp, and full-fat dairy products. How can I use the new food label to make heart-healthy food choices? Check the Nutrition Facts panel of the food label. Choose foods lower in saturated fat, trans fat, and cholesterol. For saturated fat and cholesterol, you can also use the Percent Daily Value (%DV): 5% DV or less is low, and 20% DV or more is high. (There is no %DV for trans fat.) Use the Nutrition Facts panel to choose foods low in saturated fat and cholesterol, and if the trans fat is not listed, read the ingredients and limit products that list shortening or hydrogenated or partially hydrogenated vegetable oil, which tend to be high in trans fat. POINTS TO REMEMBER:   Discuss your risk for heart disease with your caregivers, and take steps to reduce risk factors.  Change your diet. Choose foods that are low in saturated fat, trans fat, and cholesterol.  Add exercise to your daily routine if   it is not already being done. Participate in physical activity of moderate intensity, like brisk walking, for at least 30 minutes on most, and preferably all days of the week. No time? Break the 30 minutes into three, 10-minute segments during the day.  Stop smoking. If you do smoke, contact your caregiver to discuss ways in which they can help you quit.  Do not use street drugs.  Maintain a normal weight.  Maintain a healthy blood pressure.  Keep up with your blood work for checking the fats in your blood as directed by your caregiver. Document Released: 10/23/2003 Document Revised: 07/06/2011 Document Reviewed: 05/20/2008 [facility] Patient Information 2013 [place], [place]. Hypertension As your heart beats, it forces blood through your arteries. This force is your blood pressure. If the pressure is too high, it is called hypertension (HTN) or high blood pressure. HTN is dangerous  because you may have it and not know it. High blood pressure may mean that your heart has to work harder to pump blood. Your arteries may be narrow or stiff. The extra work puts you at risk for heart disease, stroke, and other problems.  Blood pressure consists of two numbers, a higher number over a lower, 110/72, for example. It is stated as "110 over 72." The ideal is below 120 for the top number (systolic) and under 80 for the bottom (diastolic). Write down your blood pressure today. You should pay close attention to your blood pressure if you have certain conditions such as:  Heart failure.  Prior heart attack.  Diabetes  Chronic kidney disease.  Prior stroke.  Multiple risk factors for heart disease. To see if you have HTN, your blood pressure should be measured while you are seated with your arm held at the level of the heart. It should be measured at least twice. A one-time elevated blood pressure reading (especially in the Emergency Department) does not mean that you need treatment. There may be conditions in which the blood pressure is different between your right and left arms. It is important to see your caregiver soon for a recheck. Most people have essential hypertension which means that there is not a specific cause. This type of high blood pressure may be lowered by changing lifestyle factors such as:  Stress.  Smoking.  Lack of exercise.  Excessive weight.  Drug/tobacco/alcohol use.  Eating less salt. Most people do not have symptoms from high blood pressure until it has caused damage to the body. Effective treatment can often prevent, delay or reduce that damage. TREATMENT  When a cause has been identified, treatment for high blood pressure is directed at the cause. There are a large number of medications to treat HTN. These fall into several categories, and your caregiver will help you select the medicines that are best for you. Medications may have side effects. You  should review side effects with your caregiver. If your blood pressure stays high after you have made lifestyle changes or started on medicines,   Your medication(s) may need to be changed.  Other problems may need to be addressed.  Be certain you understand your prescriptions, and know how and when to take your medicine.  Be sure to follow up with your caregiver within the time frame advised (usually within two weeks) to have your blood pressure rechecked and to review your medications.  If you are taking more than one medicine to lower your blood pressure, make sure you know how and at what times they should be taken.  Taking two medicines at the same time can result in blood pressure that is too low. SEEK IMMEDIATE MEDICAL CARE IF:  You develop a severe headache, blurred or changing vision, or confusion.  You have unusual weakness or numbness, or a faint feeling.  You have severe chest or abdominal pain, vomiting, or breathing problems. MAKE SURE YOU:   Understand these instructions.  Will watch your condition.  Will get help right away if you are not doing well or get worse. Document Released: 01/04/2005 Document Revised: 03/29/2011 Document Reviewed: 08/25/2007 [facility] Patient Information 2013 [place], [place].

## 2012-05-02 NOTE — Progress Notes (Signed)
[facility] [address] [place] [address] [phone]  Date:  05/02/2012   Name:  Kenneth [name]   DOB:  [birth year]   MRN:  [id number]  PCP:  No primary provider on file.    Chief Complaint: Hyperlipidemia and Hypertension   History of Present Illness:  Kenneth [name] is a 52 y.o. very pleasant male patient who presents with the following:  Treated a year ago for hypertension and dizziness in hospital in [place] and has been taken off some unknown medication and is concerned that his blood pressure is not adequately controlled.  Was treated for hyperlipidemia and now off the medications.   Concerned that he may have a left inguinal hernia.  Had a right inguinal herniorrhaphy. History of tinea versicolor and now has rash that is not responding to antifungals.  Rash is generalized and pruritic Says that he occasionally has palpitations and a "weird chest pain' while falling asleep at night.  Never with exertion.  No associated symptoms.  There is no problem list on file for this patient.   Past Medical History  Diagnosis Date  . Allergy   . Hypertension   . Hyperlipidemia     Past Surgical History  Procedure Laterality Date  . Hernia repair      History  Substance Use Topics  . Smoking status: Never Smoker   . Smokeless tobacco: Not on file  . Alcohol Use: No    Family History  Problem Relation Age of Onset  . Heart attack Father   . Stroke Maternal Grandfather   . Stroke Paternal Grandmother   . Stroke Paternal Grandfather     No Known Allergies  Medication list has been reviewed and updated.  No current outpatient prescriptions on file prior to visit.   No current facility-administered medications on file prior to visit.    Review of Systems:  As per HPI, otherwise negative.    Physical Examination: Filed Vitals:   05/02/12 1056  BP: 162/104  Pulse: 73  Temp: 97.6 F (36.4 C)  Resp: 18   Filed Vitals:   05/02/12 1056   Height: 6' 0.5" (1.842 m)  Weight: 200 lb (90.719 kg)   Body mass index is 26.74 kg/(m^2). Ideal Body Weight: Weight in (lb) to have BMI = 25: 186.5  GEN: WDWN, NAD, Non-toxic, A & O x 3 HEENT: Atraumatic, Normocephalic. Neck supple. No masses, No LAD. Ears and Nose: No external deformity. CV: RRR, No M/G/R. No JVD. No thrill. No extra heart sounds. PULM: CTA B, no wheezes, crackles, rhonchi. No retractions. No resp. distress. No accessory muscle use. ABD: S, NT, ND, +BS. No rebound. No HSM. EXTR: No c/c/e NEURO Normal gait.  PSYCH: Normally interactive. Conversant. Not depressed or anxious appearing.  Calm demeanor.  SKIN: psoriatic rash that is erythematous raised minimally and scaling in patches.   Genitalia:  Left inguinal hernia  Assessment and Plan: Hypertension Left inguinal hernia Hyperlipidemia Psoriasis Derm consult Surgical consult Labs Follow up based on labs Refer to 104   Signed,  Phillips Odor, MD   Results for orders placed in visit on 05/02/12  POCT CBC      Result Value Range   WBC 10.0  4.6 - 10.2 K/uL   Lymph, poc 1.3  0.6 - 3.4   POC LYMPH PERCENT 12.7  10 - 50 %L   MID (cbc) 0.5  0 - 0.9   POC MID % 4.6  0 - 12 %M  POC Granulocyte 8.3 (*) 2 - 6.9   Granulocyte percent 82.7 (*) 37 - 80 %G   RBC    4.69 - 6.13 M/uL   Hemoglobin 18.3 (*) 14.1 - 18.1 g/dL   HCT, POC    16.1 - 09.6 %   MCV 76.7 (*) 80 - 97 fL   MCH, POC    27 - 31.2 pg   MCHC    31.8 - 35.4 g/dL   RDW, POC 04.5     Platelet Count, POC    142 - 424 K/uL   MPV 8.2  0 - 99.8 fL  POCT UA - MICROSCOPIC ONLY      Result Value Range   WBC, Ur, HPF, POC 0-1     RBC, urine, microscopic 1-3     Bacteria, U Microscopic trace     Mucus, UA neg     Epithelial cells, urine per micros 0-1     Crystals, Ur, HPF, POC neg     Casts, Ur, LPF, POC neg     Yeast, UA neg    POCT URINALYSIS DIPSTICK      Result Value Range   Color, UA amber     Clarity, UA clear     Glucose, UA neg      Bilirubin, UA neg     Ketones, UA neg     Spec Grav, UA 1.025     Blood, UA trace-intact     pH, UA 5.5     Protein, UA trace     Urobilinogen, UA 0.2     Nitrite, UA neg     Leukocytes, UA Negative

## 2012-05-03 MED ORDER — ATORVASTATIN CALCIUM 20 MG PO TABS: 20.0000 mg | ORAL_TABLET | Freq: Every day | ORAL | Status: DC

## 2012-05-03 NOTE — Addendum Note (Signed)
Addended by: Carmelina Dane on: 05/03/2012 08:01 PM   Modules accepted: Orders

## 2012-05-08 ENCOUNTER — Telehealth: Payer: Self-pay

## 2012-05-08 DIAGNOSIS — D649 Anemia, unspecified: Secondary | ICD-10-CM

## 2012-05-08 NOTE — Telephone Encounter (Signed)
Pt has questions about his last visit with Dr. Dareen Piano.  He also said that he had made him several referrals, but forgot the one for the hematologist.  Please call [phone]

## 2012-05-08 NOTE — Telephone Encounter (Signed)
Cardiology general surgery and dermatology referrals made, now he wants hematology referral, please advise.

## 2012-05-08 NOTE — Telephone Encounter (Signed)
He wanted one because he was "anemic" but is not.  No referral needed.

## 2012-05-09 NOTE — Telephone Encounter (Signed)
Called patient, his phone is not accepting calls

## 2012-05-09 NOTE — Telephone Encounter (Signed)
Patient states he was seen by Cardiology and cardiologist recommended hematology eval. Due to a condition he has, but he can not recall the name of it, he is going to call me back and let me know what condition he has.

## 2012-05-09 NOTE — Telephone Encounter (Signed)
Ok to refer to heme per pt request

## 2012-05-09 NOTE — Telephone Encounter (Signed)
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 Dr Dareen Piano out of office.

## 2012-05-10 ENCOUNTER — Telehealth: Payer: Self-pay

## 2012-05-10 ENCOUNTER — Telehealth: Payer: Self-pay | Admitting: Oncology

## 2012-05-10 DIAGNOSIS — D45 Polycythemia vera: Secondary | ICD-10-CM

## 2012-05-10 NOTE — Telephone Encounter (Signed)
Spoke with patients sister concerning referral. Advised her that the referral was ok'd by provider.

## 2012-05-10 NOTE — Telephone Encounter (Signed)
S/W PT SISTER IN RE TO NP APPT  05/14 @ 10:30 W/DR. HA Dx-POLYCYTHEMIA VERA WELCOME PACKET MAILED.

## 2012-05-12 ENCOUNTER — Ambulatory Visit (INDEPENDENT_AMBULATORY_CARE_PROVIDER_SITE_OTHER): Payer: BC Managed Care – PPO | Admitting: Surgery

## 2012-05-12 ENCOUNTER — Encounter (INDEPENDENT_AMBULATORY_CARE_PROVIDER_SITE_OTHER): Payer: Self-pay | Admitting: Surgery

## 2012-05-12 VITALS — BP 128/88 | HR 64 | Temp 97.4°F | Resp 12 | Ht 73.0 in | Wt 204.8 lb

## 2012-05-12 DIAGNOSIS — L723 Sebaceous cyst: Secondary | ICD-10-CM | POA: Insufficient documentation

## 2012-05-12 DIAGNOSIS — K4090 Unilateral inguinal hernia, without obstruction or gangrene, not specified as recurrent: Secondary | ICD-10-CM

## 2012-05-12 DIAGNOSIS — K429 Umbilical hernia without obstruction or gangrene: Secondary | ICD-10-CM

## 2012-05-12 HISTORY — DX: Sebaceous cyst: L72.3

## 2012-05-12 HISTORY — DX: Umbilical hernia without obstruction or gangrene: K42.9

## 2012-05-12 HISTORY — DX: Unilateral inguinal hernia, without obstruction or gangrene, not specified as recurrent: K40.90

## 2012-05-12 NOTE — Progress Notes (Signed)
Patient ID: Kenneth [name], male   DOB: 01-16-1961, 52 y.o.   MRN: [id number]  Chief Complaint  Patient presents with  . Hernia    HPI Kenneth [name] is a 52 y.o. male.   HPIThis gentleman is referred to me by Dr. Marice Potter for evaluation of a left inguinal hernia. He has noticed this hernia for several months. He has had a previous right inguinal hernia repair with mesh 5 years ago in [place]. He has mild discomfort but no obstructive symptoms. He also reports a small sebaceous cyst on his left popliteal fossa. He has no other complaints.  Past Medical History  Diagnosis Date  . Allergy   . Hypertension   . Hyperlipidemia     Past Surgical History  Procedure Laterality Date  . Hernia repair      Family History  Problem Relation Age of Onset  . Heart attack Father   . Stroke Maternal Grandfather   . Stroke Paternal Grandmother   . Stroke Paternal Grandfather     Social History History  Substance Use Topics  . Smoking status: Never Smoker   . Smokeless tobacco: Not on file  . Alcohol Use: No    No Known Allergies  Current Outpatient Prescriptions  Medication Sig Dispense Refill  . atorvastatin (LIPITOR) 20 MG tablet Take 1 tablet (20 mg total) by mouth daily.  90 tablet  0  . lisinopril (PRINIVIL,ZESTRIL) 20 MG tablet Take 1 tablet (20 mg total) by mouth daily.  30 tablet  1   No current facility-administered medications for this visit.    Review of Systems Review of Systems  Constitutional: Negative for fever, chills and unexpected weight change.  HENT: Negative for hearing loss, congestion, sore throat, trouble swallowing and voice change.   Eyes: Negative for visual disturbance.  Respiratory: Negative for cough and wheezing.   Cardiovascular: Negative for chest pain, palpitations and leg swelling.  Gastrointestinal: Negative for nausea, vomiting, abdominal pain, diarrhea, constipation, blood in stool, abdominal distention, anal bleeding and rectal pain.   Genitourinary: Negative for hematuria and difficulty urinating.  Musculoskeletal: Negative for arthralgias.  Skin: Negative for rash and wound.  Neurological: Negative for seizures, syncope, weakness and headaches.  Hematological: Negative for adenopathy. Does not bruise/bleed easily.  Psychiatric/Behavioral: Negative for confusion.    Blood pressure 128/88, pulse 64, temperature 97.4 F (36.3 C), temperature source Temporal, resp. rate 12, height 6\' 1"  (1.854 m), weight 204 lb 12.8 oz (92.897 kg).  Physical Exam Physical Exam  Constitutional: He is oriented to person, place, and time. He appears well-developed and well-nourished. No distress.  HENT:  Head: Normocephalic and atraumatic.  Right Ear: External ear normal.  Left Ear: External ear normal.  Nose: Nose normal.  Mouth/Throat: Oropharynx is clear and moist. No oropharyngeal exudate.  Eyes: Conjunctivae are normal. Pupils are equal, round, and reactive to light. Right eye exhibits no discharge. Left eye exhibits no discharge. No scleral icterus.  Neck: Normal range of motion. Neck supple. No tracheal deviation present. No thyromegaly present.  Cardiovascular: Normal rate, regular rhythm, normal heart sounds and intact distal pulses.   No murmur heard. Pulmonary/Chest: Effort normal and breath sounds normal. No respiratory distress. He has no wheezes.  Abdominal: Soft. Bowel sounds are normal. There is tenderness. There is no rebound and no guarding.  He has a moderate sized easily reducible left inguinal hernia. There is no evidence of recurrent right inguinal hernia. He also has a very tiny reducible umbilical hernia  Musculoskeletal: Normal range  of motion. He exhibits no edema and no tenderness.  Lymphadenopathy:    He has no cervical adenopathy.  Neurological: He is alert and oriented to person, place, and time.  Skin: Skin is warm and dry. No rash noted. He is not diaphoretic. No erythema.  Psychiatric: His behavior is  normal. Judgment normal.    Data Reviewed   Assessment    Left inguinal hernia Umbilical hernia Left popliteal fossa sebaceous cyst     Plan    I explained the diagnosis to him in detail. I would recommend laparoscopic left inguinal hernia repair with mesh which would allow me to also repair the umbilical hernia. I discussed the advantages and disadvantages of both open and laparoscopic procedures. I discussed the risks which includes but is not limited to bleeding, infection, recurrence, injury to surrounding structures, chronic pain, et Karie Soda. And he also has a sebaceous cyst which I would remove it the same time in the popliteal fossa on the left side. He wants to go home and think about this and then  He will call back in decide which procedure to perform.   Kenneth [name] A 05/12/2012, 3:41 PM

## 2012-05-18 ENCOUNTER — Ambulatory Visit: Payer: Self-pay | Admitting: Internal Medicine

## 2012-05-18 LAB — CBC CANCER CENTER
Basophil #: 0 x10 3/mm (ref 0.0–0.1)
Basophil %: 0.4 %
HCT: 56.9 % — ABNORMAL HIGH (ref 40.0–52.0)
Lymphocyte #: 1.2 x10 3/mm (ref 1.0–3.6)
Lymphocyte %: 13 %
MCH: 22.9 pg — ABNORMAL LOW (ref 26.0–34.0)
MCHC: 31.1 g/dL — ABNORMAL LOW (ref 32.0–36.0)
MCV: 74 fL — ABNORMAL LOW (ref 80–100)
Monocyte #: 0.6 x10 3/mm (ref 0.2–1.0)
Monocyte %: 6.9 %
Neutrophil #: 7.3 x10 3/mm — ABNORMAL HIGH (ref 1.4–6.5)
Platelet: 338 x10 3/mm (ref 150–440)
RDW: 19.6 % — ABNORMAL HIGH (ref 11.5–14.5)
WBC: 9.3 x10 3/mm (ref 3.8–10.6)

## 2012-05-18 LAB — HEPATIC FUNCTION PANEL A (ARMC)
Albumin: 3.9 g/dL (ref 3.4–5.0)
Bilirubin, Direct: 0.2 mg/dL (ref 0.00–0.20)
SGOT(AST): 22 U/L (ref 15–37)

## 2012-05-18 LAB — IRON AND TIBC
Iron Bind.Cap.(Total): 390 ug/dL (ref 250–450)
Iron Saturation: 8 %

## 2012-05-18 LAB — CREATININE, SERUM
Creatinine: 1.08 mg/dL (ref 0.60–1.30)
EGFR (African American): >60

## 2012-05-18 LAB — FERRITIN: Ferritin (ARMC): 5 ng/mL — ABNORMAL LOW (ref 8–388)

## 2012-05-22 LAB — CANCER CENTER HEMATOCRIT: HCT: 52.5 % — ABNORMAL HIGH (ref 40.0–52.0)

## 2012-05-25 LAB — CANCER CENTER HEMATOCRIT: HCT: 53.9 % — ABNORMAL HIGH (ref 40.0–52.0)

## 2012-05-31 ENCOUNTER — Ambulatory Visit: Payer: Self-pay | Admitting: Oncology

## 2012-05-31 ENCOUNTER — Other Ambulatory Visit: Payer: Self-pay | Admitting: Lab

## 2012-05-31 ENCOUNTER — Ambulatory Visit: Payer: Self-pay

## 2012-06-01 LAB — CANCER CENTER HEMATOCRIT: HCT: 46.1 % (ref 40.0–52.0)

## 2012-06-02 ENCOUNTER — Ambulatory Visit: Payer: BC Managed Care – PPO | Admitting: Family Medicine

## 2012-06-08 LAB — CANCER CENTER HEMATOCRIT: HCT: 44.5 % (ref 40.0–52.0)

## 2012-06-15 LAB — CBC CANCER CENTER
Basophil #: 0.1 x10 3/mm (ref 0.0–0.1)
Eosinophil %: 1.5 %
HGB: 13.3 g/dL (ref 13.0–18.0)
Lymphocyte %: 14.1 %
MCH: 22.8 pg — ABNORMAL LOW (ref 26.0–34.0)
MCHC: 31.5 g/dL — ABNORMAL LOW (ref 32.0–36.0)
MCV: 72 fL — ABNORMAL LOW (ref 80–100)
Monocyte %: 5.7 %
Neutrophil %: 77.3 %
Platelet: 365 x10 3/mm (ref 150–440)
RBC: 5.83 10*6/uL (ref 4.40–5.90)
RDW: 18.6 % — ABNORMAL HIGH (ref 11.5–14.5)

## 2012-06-17 ENCOUNTER — Ambulatory Visit: Payer: Self-pay | Admitting: Emergency Medicine

## 2012-06-17 LAB — URINALYSIS, COMPLETE
Bacteria: NEGATIVE
Bilirubin,UR: NEGATIVE
Leukocyte Esterase: NEGATIVE
Protein: NEGATIVE
Squamous Epithelial: NONE SEEN
WBC UR: NONE SEEN /HPF (ref 0–5)

## 2012-06-18 ENCOUNTER — Ambulatory Visit: Payer: Self-pay | Admitting: Internal Medicine

## 2012-06-22 LAB — CANCER CENTER HEMATOCRIT: HCT: 41.9 % (ref 40.0–52.0)

## 2012-06-23 NOTE — Telephone Encounter (Signed)
error 

## 2012-07-18 ENCOUNTER — Ambulatory Visit: Payer: Self-pay | Admitting: Internal Medicine

## 2012-08-03 LAB — CANCER CENTER HEMATOCRIT: HCT: 40.4 % (ref 40.0–52.0)

## 2012-08-18 ENCOUNTER — Ambulatory Visit: Payer: Self-pay | Admitting: Internal Medicine

## 2012-08-22 ENCOUNTER — Other Ambulatory Visit: Payer: Self-pay | Admitting: Emergency Medicine

## 2012-09-14 LAB — CBC CANCER CENTER
Basophil #: 0.1 x10 3/mm (ref 0.0–0.1)
Eosinophil #: 0.2 x10 3/mm (ref 0.0–0.7)
Eosinophil %: 1.7 %
HCT: 41.9 % (ref 40.0–52.0)
Lymphocyte #: 1.3 x10 3/mm (ref 1.0–3.6)
Lymphocyte %: 13.7 %
MCV: 67 fL — ABNORMAL LOW (ref 80–100)
Monocyte #: 0.6 x10 3/mm (ref 0.2–1.0)
Neutrophil #: 7.2 x10 3/mm — ABNORMAL HIGH (ref 1.4–6.5)
Platelet: 349 x10 3/mm (ref 150–440)
RBC: 6.29 10*6/uL — ABNORMAL HIGH (ref 4.40–5.90)
WBC: 9.4 x10 3/mm (ref 3.8–10.6)

## 2012-09-18 ENCOUNTER — Ambulatory Visit: Payer: Self-pay | Admitting: Internal Medicine

## 2012-10-12 LAB — CANCER CENTER HEMATOCRIT: HCT: 42.8 % (ref 40.0–52.0)

## 2012-10-17 ENCOUNTER — Other Ambulatory Visit: Payer: Self-pay | Admitting: Physician Assistant

## 2012-10-18 ENCOUNTER — Ambulatory Visit: Payer: Self-pay | Admitting: Internal Medicine

## 2012-10-18 DIAGNOSIS — I639 Cerebral infarction, unspecified: Secondary | ICD-10-CM

## 2012-10-18 HISTORY — PX: CHOLECYSTECTOMY OPEN: SUR202

## 2012-10-18 HISTORY — PX: CHOLECYSTECTOMY: SHX55

## 2012-10-18 HISTORY — PX: CRANIOPLASTY: SUR330

## 2012-10-18 HISTORY — PX: ABDOMINAL EXPLORATION SURGERY: SHX538

## 2012-10-18 HISTORY — PX: COCCYX FRACTURE SURGERY: SHX599

## 2012-10-18 HISTORY — PX: TIBIA FRACTURE SURGERY: SHX806

## 2012-10-18 HISTORY — DX: Cerebral infarction, unspecified: I63.9

## 2012-10-18 HISTORY — PX: SPLENECTOMY, TOTAL: SHX788

## 2014-03-12 ENCOUNTER — Emergency Department (HOSPITAL_COMMUNITY): Payer: BLUE CROSS/BLUE SHIELD

## 2014-03-12 ENCOUNTER — Encounter (HOSPITAL_COMMUNITY): Payer: Self-pay

## 2014-03-12 ENCOUNTER — Inpatient Hospital Stay (HOSPITAL_COMMUNITY)
Admission: EM | Admit: 2014-03-12 | Discharge: 2014-03-15 | DRG: 195 | Disposition: A | Discharge status: Home / Self Care | Payer: BLUE CROSS/BLUE SHIELD | Attending: Internal Medicine | Admitting: Internal Medicine

## 2014-03-12 DIAGNOSIS — Z79899 Other long term (current) drug therapy: Secondary | ICD-10-CM | POA: Diagnosis not present

## 2014-03-12 DIAGNOSIS — G8929 Other chronic pain: Secondary | ICD-10-CM | POA: Diagnosis present

## 2014-03-12 DIAGNOSIS — E785 Hyperlipidemia, unspecified: Secondary | ICD-10-CM | POA: Diagnosis present

## 2014-03-12 DIAGNOSIS — J111 Influenza due to unidentified influenza virus with other respiratory manifestations: Secondary | ICD-10-CM

## 2014-03-12 DIAGNOSIS — R509 Fever, unspecified: Secondary | ICD-10-CM | POA: Diagnosis present

## 2014-03-12 DIAGNOSIS — I10 Essential (primary) hypertension: Secondary | ICD-10-CM | POA: Diagnosis present

## 2014-03-12 DIAGNOSIS — M549 Dorsalgia, unspecified: Secondary | ICD-10-CM

## 2014-03-12 DIAGNOSIS — J029 Acute pharyngitis, unspecified: Secondary | ICD-10-CM

## 2014-03-12 DIAGNOSIS — J101 Influenza due to other identified influenza virus with other respiratory manifestations: Principal | ICD-10-CM | POA: Diagnosis present

## 2014-03-12 DIAGNOSIS — E876 Hypokalemia: Secondary | ICD-10-CM | POA: Diagnosis present

## 2014-03-12 DIAGNOSIS — Z9081 Acquired absence of spleen: Secondary | ICD-10-CM | POA: Diagnosis present

## 2014-03-12 DIAGNOSIS — Z7982 Long term (current) use of aspirin: Secondary | ICD-10-CM | POA: Diagnosis not present

## 2014-03-12 DIAGNOSIS — R312 Other microscopic hematuria: Secondary | ICD-10-CM | POA: Diagnosis present

## 2014-03-12 DIAGNOSIS — D45 Polycythemia vera: Secondary | ICD-10-CM | POA: Diagnosis present

## 2014-03-12 DIAGNOSIS — M545 Low back pain: Secondary | ICD-10-CM | POA: Diagnosis present

## 2014-03-12 HISTORY — DX: Depression, unspecified: F32.A

## 2014-03-12 HISTORY — DX: Major depressive disorder, single episode, unspecified: F32.9

## 2014-03-12 HISTORY — DX: Polycythemia vera: D45

## 2014-03-12 HISTORY — DX: Cerebral infarction, unspecified: I63.9

## 2014-03-12 HISTORY — DX: Personal history of other medical treatment: Z92.89

## 2014-03-12 HISTORY — DX: Neuralgia and neuritis, unspecified: M79.2

## 2014-03-12 HISTORY — DX: Influenza due to unidentified influenza virus with other respiratory manifestations: J11.1

## 2014-03-12 LAB — CBC WITH DIFFERENTIAL/PLATELET
Basophils Absolute: 0 10*3/uL (ref 0.0–0.1)
Basophils Relative: 0 % (ref 0–1)
Eosinophils Absolute: 0 10*3/uL (ref 0.0–0.7)
Eosinophils Relative: 0 % (ref 0–5)
HCT: 40.7 % (ref 39.0–52.0)
Hemoglobin: 14 g/dL (ref 13.0–17.0)
LYMPHS PCT: 16 % (ref 12–46)
Lymphs Abs: 1.3 10*3/uL (ref 0.7–4.0)
MCH: 38.3 pg — ABNORMAL HIGH (ref 26.0–34.0)
MCHC: 34.4 g/dL (ref 30.0–36.0)
MCV: 111.2 fL — ABNORMAL HIGH (ref 78.0–100.0)
MONOS PCT: 20 % — AB (ref 3–12)
Monocytes Absolute: 1.7 10*3/uL — ABNORMAL HIGH (ref 0.1–1.0)
NEUTROS ABS: 5.4 10*3/uL (ref 1.7–7.7)
NEUTROS PCT: 64 % (ref 43–77)
Platelets: 281 10*3/uL (ref 150–400)
RBC: 3.66 MIL/uL — ABNORMAL LOW (ref 4.22–5.81)
RDW: 13.7 % (ref 11.5–15.5)
WBC: 8.4 10*3/uL (ref 4.0–10.5)

## 2014-03-12 LAB — I-STAT CG4 LACTIC ACID, ED
Lactic Acid, Venous: 2.14 mmol/L (ref 0.5–2.0)
Lactic Acid, Venous: 1.92 mmol/L (ref 0.5–2.0)

## 2014-03-12 LAB — COMPREHENSIVE METABOLIC PANEL
ALBUMIN: 3.9 g/dL (ref 3.5–5.2)
ALT: 27 U/L (ref 0–53)
AST: 42 U/L — ABNORMAL HIGH (ref 0–37)
Alkaline Phosphatase: 98 U/L (ref 39–117)
Anion gap: 7 (ref 5–15)
BUN: 12 mg/dL (ref 6–23)
CALCIUM: 8.8 mg/dL (ref 8.4–10.5)
CO2: 27 mmol/L (ref 19–32)
CREATININE: 1.09 mg/dL (ref 0.50–1.35)
Chloride: 102 mmol/L (ref 96–112)
GFR calc Af Amer: 89 mL/min — ABNORMAL LOW (ref >90)
GFR calc non Af Amer: 77 mL/min — ABNORMAL LOW (ref >90)
Glucose, Bld: 163 mg/dL — ABNORMAL HIGH (ref 70–99)
Potassium: 3.7 mmol/L (ref 3.5–5.1)
SODIUM: 136 mmol/L (ref 135–145)
TOTAL PROTEIN: 7.6 g/dL (ref 6.0–8.3)
Total Bilirubin: 0.7 mg/dL (ref 0.3–1.2)

## 2014-03-12 LAB — INFLUENZA PANEL BY PCR (TYPE A & B)
H1N1 flu by pcr: NOT DETECTED
INFLAPCR: NEGATIVE
INFLBPCR: POSITIVE — AB

## 2014-03-12 LAB — URINALYSIS, ROUTINE W REFLEX MICROSCOPIC
Bilirubin Urine: NEGATIVE
Glucose, UA: NEGATIVE mg/dL
Ketones, ur: 15 mg/dL — AB
Leukocytes, UA: NEGATIVE
NITRITE: NEGATIVE
PH: 6 (ref 5.0–8.0)
PROTEIN: NEGATIVE mg/dL
Specific Gravity, Urine: 1.026 (ref 1.005–1.030)
Urobilinogen, UA: 1 mg/dL (ref 0.0–1.0)

## 2014-03-12 LAB — RAPID STREP SCREEN (MED CTR MEBANE ONLY): STREPTOCOCCUS, GROUP A SCREEN (DIRECT): NEGATIVE

## 2014-03-12 LAB — URINE MICROSCOPIC-ADD ON

## 2014-03-12 MED ORDER — ASPIRIN EC 81 MG PO TBEC
81.0000 mg | DELAYED_RELEASE_TABLET | Freq: Every day | ORAL | Status: DC
  Administered 2014-03-13 – 2014-03-15 (×3): 81 mg via ORAL
  Filled 2014-03-12 (×3): qty 1

## 2014-03-12 MED ORDER — ACETAMINOPHEN 325 MG PO TABS: 650.0000 mg | ORAL_TABLET | Freq: Four times a day (QID) | ORAL | Status: DC | PRN

## 2014-03-12 MED ORDER — ACETAMINOPHEN 650 MG RE SUPP: 650.0000 mg | Freq: Four times a day (QID) | RECTAL | Status: DC | PRN

## 2014-03-12 MED ORDER — SODIUM CHLORIDE 0.9 % IJ SOLN
3.0000 mL | Freq: Two times a day (BID) | INTRAMUSCULAR | Status: DC
  Administered 2014-03-12 – 2014-03-15 (×5): 3 mL via INTRAVENOUS

## 2014-03-12 MED ORDER — LISINOPRIL 10 MG PO TABS
10.0000 mg | ORAL_TABLET | Freq: Every day | ORAL | Status: DC
  Administered 2014-03-13 – 2014-03-15 (×3): 10 mg via ORAL
  Filled 2014-03-12 (×3): qty 1

## 2014-03-12 MED ORDER — VANCOMYCIN HCL IN DEXTROSE 1-5 GM/200ML-% IV SOLN
1000.0000 mg | Freq: Once | INTRAVENOUS | Status: AC
  Administered 2014-03-12: 1000 mg via INTRAVENOUS
  Filled 2014-03-12: qty 200

## 2014-03-12 MED ORDER — PIPERACILLIN-TAZOBACTAM 3.375 G IVPB
3.3750 g | Freq: Once | INTRAVENOUS | Status: AC
  Administered 2014-03-12: 3.375 g via INTRAVENOUS
  Filled 2014-03-12: qty 50

## 2014-03-12 MED ORDER — HEPARIN SODIUM (PORCINE) 5000 UNIT/ML IJ SOLN
5000.0000 [IU] | Freq: Three times a day (TID) | INTRAMUSCULAR | Status: DC
  Filled 2014-03-12 (×10): qty 1

## 2014-03-12 MED ORDER — HYDROCODONE-ACETAMINOPHEN 5-325 MG PO TABS
1.0000 | ORAL_TABLET | Freq: Four times a day (QID) | ORAL | Status: DC | PRN
  Administered 2014-03-12 – 2014-03-13 (×2): 1 via ORAL
  Filled 2014-03-12 (×3): qty 1

## 2014-03-12 MED ORDER — ACETAMINOPHEN 325 MG PO TABS
650.0000 mg | ORAL_TABLET | Freq: Once | ORAL | Status: AC
  Administered 2014-03-12: 650 mg via ORAL
  Filled 2014-03-12: qty 2

## 2014-03-12 MED ORDER — SODIUM CHLORIDE 0.9 % IV BOLUS (SEPSIS)
1000.0000 mL | Freq: Once | INTRAVENOUS | Status: AC
  Administered 2014-03-12: 1000 mL via INTRAVENOUS

## 2014-03-12 MED ORDER — ATENOLOL 50 MG PO TABS
50.0000 mg | ORAL_TABLET | Freq: Every day | ORAL | Status: DC
Start: 2014-03-13 — End: 2014-03-15
  Administered 2014-03-13 – 2014-03-15 (×3): 50 mg via ORAL
  Filled 2014-03-12 (×3): qty 1

## 2014-03-12 MED ORDER — OSELTAMIVIR PHOSPHATE 75 MG PO CAPS
75.0000 mg | ORAL_CAPSULE | Freq: Two times a day (BID) | ORAL | Status: DC
  Administered 2014-03-12 – 2014-03-15 (×6): 75 mg via ORAL
  Filled 2014-03-12 (×7): qty 1

## 2014-03-12 NOTE — ED Notes (Signed)
Patient transported to X-ray 

## 2014-03-12 NOTE — ED Notes (Signed)
PREVIOUS RECORDS AT [place]
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
## 2014-03-12 NOTE — ED Notes (Signed)
EDP at bedside  

## 2014-03-12 NOTE — ED Notes (Signed)
Attempted to call report
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
## 2014-03-12 NOTE — ED Notes (Signed)
Internal med at bedside.
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
## 2014-03-13 DIAGNOSIS — J111 Influenza due to unidentified influenza virus with other respiratory manifestations: Secondary | ICD-10-CM

## 2014-03-13 LAB — COMPREHENSIVE METABOLIC PANEL
ALT: 25 U/L (ref 0–53)
ANION GAP: 8 (ref 5–15)
AST: 34 U/L (ref 0–37)
Albumin: 3.4 g/dL — ABNORMAL LOW (ref 3.5–5.2)
Alkaline Phosphatase: 81 U/L (ref 39–117)
BUN: 10 mg/dL (ref 6–23)
CALCIUM: 8.2 mg/dL — AB (ref 8.4–10.5)
CO2: 22 mmol/L (ref 19–32)
Chloride: 106 mmol/L (ref 96–112)
Creatinine, Ser: 0.93 mg/dL (ref 0.50–1.35)
GLUCOSE: 94 mg/dL (ref 70–99)
Potassium: 3.4 mmol/L — ABNORMAL LOW (ref 3.5–5.1)
SODIUM: 136 mmol/L (ref 135–145)
Total Bilirubin: 0.9 mg/dL (ref 0.3–1.2)
Total Protein: 6.8 g/dL (ref 6.0–8.3)

## 2014-03-13 LAB — CBC WITH DIFFERENTIAL/PLATELET
BASOS PCT: 0 % (ref 0–1)
Basophils Absolute: 0 10*3/uL (ref 0.0–0.1)
EOS ABS: 0 10*3/uL (ref 0.0–0.7)
Eosinophils Relative: 0 % (ref 0–5)
HEMATOCRIT: 38.6 % — AB (ref 39.0–52.0)
HEMOGLOBIN: 13.3 g/dL (ref 13.0–17.0)
Lymphocytes Relative: 26 % (ref 12–46)
Lymphs Abs: 2.4 10*3/uL (ref 0.7–4.0)
MCH: 37.9 pg — AB (ref 26.0–34.0)
MCHC: 34.5 g/dL (ref 30.0–36.0)
MCV: 110 fL — ABNORMAL HIGH (ref 78.0–100.0)
MONO ABS: 2.3 10*3/uL — AB (ref 0.1–1.0)
MONOS PCT: 24 % — AB (ref 3–12)
Neutro Abs: 4.8 10*3/uL (ref 1.7–7.7)
Neutrophils Relative %: 50 % (ref 43–77)
Platelets: 248 10*3/uL (ref 150–400)
RBC: 3.51 MIL/uL — ABNORMAL LOW (ref 4.22–5.81)
RDW: 13.8 % (ref 11.5–15.5)
WBC: 9.5 10*3/uL (ref 4.0–10.5)

## 2014-03-13 MED ORDER — ACETAMINOPHEN 325 MG PO TABS
650.0000 mg | ORAL_TABLET | Freq: Four times a day (QID) | ORAL | Status: DC | PRN
  Administered 2014-03-13 (×2): 650 mg via ORAL
  Filled 2014-03-13 (×4): qty 2

## 2014-03-13 MED ORDER — VANCOMYCIN HCL 10 G IV SOLR
1500.0000 mg | Freq: Two times a day (BID) | INTRAVENOUS | Status: DC
  Administered 2014-03-13 – 2014-03-15 (×3): 1500 mg via INTRAVENOUS
  Filled 2014-03-13 (×6): qty 1500

## 2014-03-13 MED ORDER — HYDROXYUREA 500 MG PO CAPS
1000.0000 mg | ORAL_CAPSULE | Freq: Every day | ORAL | Status: DC
  Administered 2014-03-13 – 2014-03-15 (×3): 1000 mg via ORAL
  Filled 2014-03-13 (×3): qty 2

## 2014-03-13 MED ORDER — PIPERACILLIN-TAZOBACTAM 3.375 G IVPB
3.3750 g | Freq: Three times a day (TID) | INTRAVENOUS | Status: DC
  Administered 2014-03-13 – 2014-03-14 (×2): 3.375 g via INTRAVENOUS
  Filled 2014-03-13 (×5): qty 50

## 2014-03-13 MED ORDER — POTASSIUM CHLORIDE CRYS ER 20 MEQ PO TBCR
40.0000 meq | EXTENDED_RELEASE_TABLET | Freq: Once | ORAL | Status: AC
  Administered 2014-03-13: 40 meq via ORAL
  Filled 2014-03-13: qty 2

## 2014-03-13 MED ORDER — PIPERACILLIN-TAZOBACTAM 3.375 G IVPB
3.3750 g | Freq: Three times a day (TID) | INTRAVENOUS | Status: DC
  Administered 2014-03-13: 3.375 g via INTRAVENOUS
  Filled 2014-03-13 (×3): qty 50

## 2014-03-13 MED ORDER — VANCOMYCIN HCL 10 G IV SOLR
1500.0000 mg | Freq: Two times a day (BID) | INTRAVENOUS | Status: DC
  Filled 2014-03-13 (×2): qty 1500

## 2014-03-13 MED ORDER — VANCOMYCIN HCL 10 G IV SOLR
1500.0000 mg | Freq: Two times a day (BID) | INTRAVENOUS | Status: DC
  Administered 2014-03-13: 1500 mg via INTRAVENOUS
  Filled 2014-03-13 (×3): qty 1500

## 2014-03-13 NOTE — Progress Notes (Signed)
UR Completed.  [phone]  
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
## 2014-03-13 NOTE — Progress Notes (Signed)
Attempted to give pt Tylenol for fever; pt refused and said he may take it later.
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
## 2014-03-14 DIAGNOSIS — R312 Other microscopic hematuria: Secondary | ICD-10-CM

## 2014-03-14 LAB — CBC WITH DIFFERENTIAL/PLATELET
BASOS ABS: 0 10*3/uL (ref 0.0–0.1)
Basophils Relative: 0 % (ref 0–1)
EOS PCT: 0 % (ref 0–5)
Eosinophils Absolute: 0 10*3/uL (ref 0.0–0.7)
HEMATOCRIT: 38.8 % — AB (ref 39.0–52.0)
Hemoglobin: 13.6 g/dL (ref 13.0–17.0)
Lymphocytes Relative: 18 % (ref 12–46)
Lymphs Abs: 1.9 10*3/uL (ref 0.7–4.0)
MCH: 39.1 pg — AB (ref 26.0–34.0)
MCHC: 35.1 g/dL (ref 30.0–36.0)
MCV: 111.5 fL — ABNORMAL HIGH (ref 78.0–100.0)
Monocytes Absolute: 1.3 10*3/uL — ABNORMAL HIGH (ref 0.1–1.0)
Monocytes Relative: 12 % (ref 3–12)
NEUTROS ABS: 7.3 10*3/uL (ref 1.7–7.7)
Neutrophils Relative %: 70 % (ref 43–77)
Platelets: 240 10*3/uL (ref 150–400)
RBC: 3.48 MIL/uL — ABNORMAL LOW (ref 4.22–5.81)
RDW: 14 % (ref 11.5–15.5)
WBC MORPHOLOGY: INCREASED
WBC: 10.5 10*3/uL (ref 4.0–10.5)

## 2014-03-14 LAB — BASIC METABOLIC PANEL
ANION GAP: 6 (ref 5–15)
BUN: 10 mg/dL (ref 6–23)
CALCIUM: 8.3 mg/dL — AB (ref 8.4–10.5)
CHLORIDE: 104 mmol/L (ref 96–112)
CO2: 26 mmol/L (ref 19–32)
CREATININE: 1 mg/dL (ref 0.50–1.35)
GFR, EST NON AFRICAN AMERICAN: 85 mL/min — AB (ref >90)
Glucose, Bld: 139 mg/dL — ABNORMAL HIGH (ref 70–99)
POTASSIUM: 3.3 mmol/L — AB (ref 3.5–5.1)
Sodium: 136 mmol/L (ref 135–145)

## 2014-03-14 MED ORDER — POTASSIUM CHLORIDE CRYS ER 20 MEQ PO TBCR
40.0000 meq | EXTENDED_RELEASE_TABLET | Freq: Once | ORAL | Status: AC
  Administered 2014-03-14: 40 meq via ORAL
  Filled 2014-03-14: qty 2

## 2014-03-14 MED ORDER — POTASSIUM CHLORIDE CRYS ER 20 MEQ PO TBCR: 40.0000 meq | EXTENDED_RELEASE_TABLET | Freq: Two times a day (BID) | ORAL | Status: DC

## 2014-03-14 MED ORDER — PHENOL 1.4 % MT LIQD
1.0000 | OROMUCOSAL | Status: DC | PRN
  Administered 2014-03-14: 1 via OROMUCOSAL
  Filled 2014-03-14: qty 177

## 2014-03-14 NOTE — Progress Notes (Signed)
03/14/2014 1400 NCM spoke to pt and states he just moved back to his home from [place]. State he does not have a PCP. Provided pt with contact for [facility] and explained he can go to [employer] or call toll free number on his card to locate a provider in his area. Pt states he prefers outpt PT at [facility]. Provided [facility] brochure as an option for PCP. Pt states he was approved for his disability and Medicaid. States when he called the MD on his medicaid letter. States the MD office stated they no longer accepted Medicaid. Explained to him to follow up with his Medicaid SW for another list of physicians.  Jonnie Finner RN CCM Case Mgmt phone [phone]
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
## 2014-03-14 NOTE — Progress Notes (Signed)
Patient refused both antibiotics (Zosyn and Vancomycin). He states he is getting a lot of medicines and that he will resume the antibiotics 03/14/14 @ 08:00 since he has already had them during the day (03/13/14). Patient educated on frequency of medication and why he is getting them.

## 2014-03-15 DIAGNOSIS — M545 Low back pain: Secondary | ICD-10-CM

## 2014-03-15 LAB — BASIC METABOLIC PANEL
Anion gap: 8 (ref 5–15)
BUN: 9 mg/dL (ref 6–23)
CO2: 24 mmol/L (ref 19–32)
Calcium: 8.1 mg/dL — ABNORMAL LOW (ref 8.4–10.5)
Chloride: 107 mmol/L (ref 96–112)
Creatinine, Ser: 0.79 mg/dL (ref 0.50–1.35)
GFR calc Af Amer: >90 mL/min (ref >90)
GFR calc non Af Amer: >90 mL/min (ref >90)
GLUCOSE: 102 mg/dL — AB (ref 70–99)
Potassium: 3.7 mmol/L (ref 3.5–5.1)
SODIUM: 139 mmol/L (ref 135–145)

## 2014-03-15 LAB — CBC WITH DIFFERENTIAL/PLATELET
BASOS ABS: 0 10*3/uL (ref 0.0–0.1)
Basophils Relative: 0 % (ref 0–1)
EOS PCT: 0 % (ref 0–5)
Eosinophils Absolute: 0 10*3/uL (ref 0.0–0.7)
HCT: 39.2 % (ref 39.0–52.0)
Hemoglobin: 13.6 g/dL (ref 13.0–17.0)
LYMPHS PCT: 18 % (ref 12–46)
Lymphs Abs: 1.7 10*3/uL (ref 0.7–4.0)
MCH: 38.7 pg — ABNORMAL HIGH (ref 26.0–34.0)
MCHC: 34.7 g/dL (ref 30.0–36.0)
MCV: 111.7 fL — AB (ref 78.0–100.0)
MONOS PCT: 11 % (ref 3–12)
Monocytes Absolute: 1.1 10*3/uL — ABNORMAL HIGH (ref 0.1–1.0)
Neutro Abs: 6.8 10*3/uL (ref 1.7–7.7)
Neutrophils Relative %: 71 % (ref 43–77)
PLATELETS: 217 10*3/uL (ref 150–400)
RBC: 3.51 MIL/uL — ABNORMAL LOW (ref 4.22–5.81)
RDW: 14 % (ref 11.5–15.5)
WBC: 9.6 10*3/uL (ref 4.0–10.5)

## 2014-03-15 LAB — CULTURE, GROUP A STREP: Strep A Culture: NEGATIVE

## 2014-03-15 MED ORDER — OSELTAMIVIR PHOSPHATE 75 MG PO CAPS: 75.0000 mg | ORAL_CAPSULE | Freq: Two times a day (BID) | ORAL | Status: DC

## 2014-03-15 MED ORDER — AMOXICILLIN-POT CLAVULANATE 500-125 MG PO TABS: 1.0000 | ORAL_TABLET | Freq: Three times a day (TID) | ORAL | Status: DC

## 2014-03-15 NOTE — Progress Notes (Signed)
Patient to be discharged to home. Prescriptions and discharge instructions reviewed with patient. No equipment needed at discharge. PIV removed.  Joellen Jersey, RN.

## 2014-03-15 NOTE — Discharge Instructions (Signed)
Take tamiflu once tonight and then twice a day until you finish your tablets.   Start taking augmentin three times a day for 3 days.   We recommend that you call Urology and make an appointment with them.  If you spike a fever go to the ED.   Influenza Influenza ("the flu") is a viral infection of the respiratory tract. It occurs more often in winter months because people spend more time in close contact with one another. Influenza can make you feel very sick. Influenza easily spreads from person to person (contagious). CAUSES  Influenza is caused by a virus that infects the respiratory tract. You can catch the virus by breathing in droplets from an infected person's cough or sneeze. You can also catch the virus by touching something that was recently contaminated with the virus and then touching your mouth, nose, or eyes. RISKS AND COMPLICATIONS You may be at risk for a more severe case of influenza if you smoke cigarettes, have diabetes, have chronic heart disease (such as heart failure) or lung disease (such as asthma), or if you have a weakened immune system. Elderly people and pregnant women are also at risk for more serious infections. The most common problem of influenza is a lung infection (pneumonia). Sometimes, this problem can require emergency medical care and may be life threatening. SIGNS AND SYMPTOMS  Symptoms typically last 4 to 10 days and may include:  Fever.  Chills.  Headache, body aches, and muscle aches.  Sore throat.  Chest discomfort and cough.  Poor appetite.  Weakness or feeling tired.  Dizziness.  Nausea or vomiting. DIAGNOSIS  Diagnosis of influenza is often made based on your history and a physical exam. A nose or throat swab test can be done to confirm the diagnosis. TREATMENT  In mild cases, influenza goes away on its own. Treatment is directed at relieving symptoms. For more severe cases, your health care provider may prescribe antiviral medicines  to shorten the sickness. Antibiotic medicines are not effective because the infection is caused by a virus, not by bacteria. HOME CARE INSTRUCTIONS  Take medicines only as directed by your health care provider.  Use a cool mist humidifier to make breathing easier.  Get plenty of rest until your temperature returns to normal. This usually takes 3 to 4 days.  Drink enough fluid to keep your urine clear or pale yellow.  Cover yourmouth and nosewhen coughing or sneezing,and wash your handswellto prevent thevirusfrom spreading.  Stay homefromwork orschool untilthe fever is gonefor at least 5full day. PREVENTION  An annual influenza vaccination (flu shot) is the best way to avoid getting influenza. An annual flu shot is now routinely recommended for all adults in the [place] IF:  You experiencechest pain, yourcough worsens,or you producemore mucus.  Youhave nausea,vomiting, ordiarrhea.  Your fever returns or gets worse. SEEK IMMEDIATE MEDICAL CARE IF:  You havetrouble breathing, you become short of breath,or your skin ornails becomebluish.  You have severe painor stiffnessin the neck.  You develop a sudden headache, or pain in the face or ear.  You have nausea or vomiting that you cannot control. MAKE SURE YOU:   Understand these instructions.  Will watch your condition.  Will get help right away if you are not doing well or get worse. Document Released: 01/02/2000 Document Revised: 05/21/2013 Document Reviewed: 04/05/2011 [facility] Patient Information 2015 [place], [place]. This information is not intended to replace advice given to you by your health care provider. Make sure  you discuss any questions you have with your health care provider. ° °
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
## 2014-03-18 LAB — CULTURE, BLOOD (ROUTINE X 2)
CULTURE: NO GROWTH
Culture: NO GROWTH

## 2014-08-05 IMAGING — US ABDOMEN ULTRASOUND LIMITED
1 series · 14 of 25 positions shown · non-contrast
Comparison: none

REASON FOR EXAM: newly diagnose polycythemia eval kidney mass or
hepatosplenomegaly
COMMENTS:

[Series 1: abdomen ultrasound limited · 0.25mm/px · 14 of 80 slices shown]
[im 1/80]
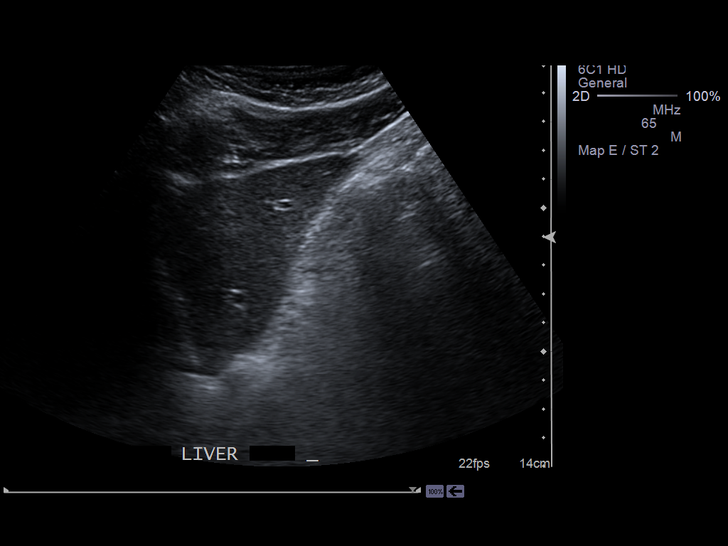
[im 7/80]
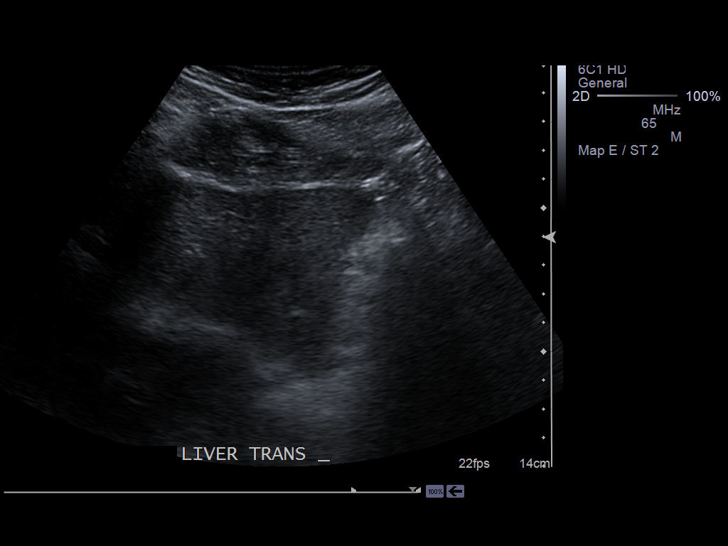
[im 14/80]
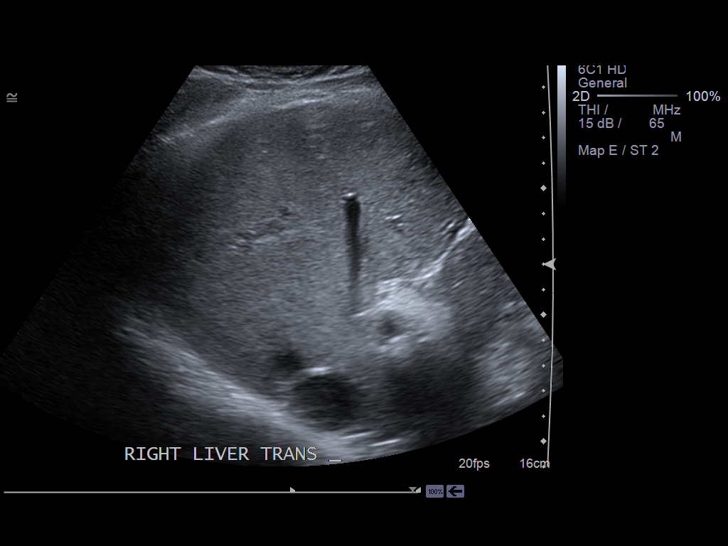
[im 20/80]
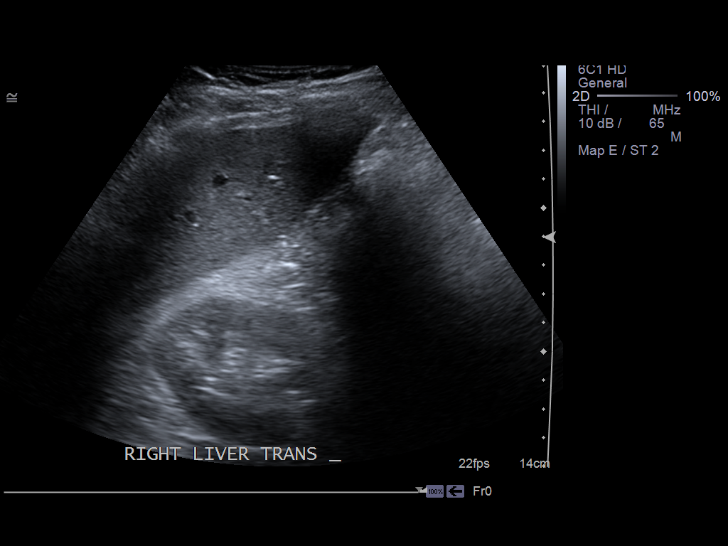
[im 27/80]
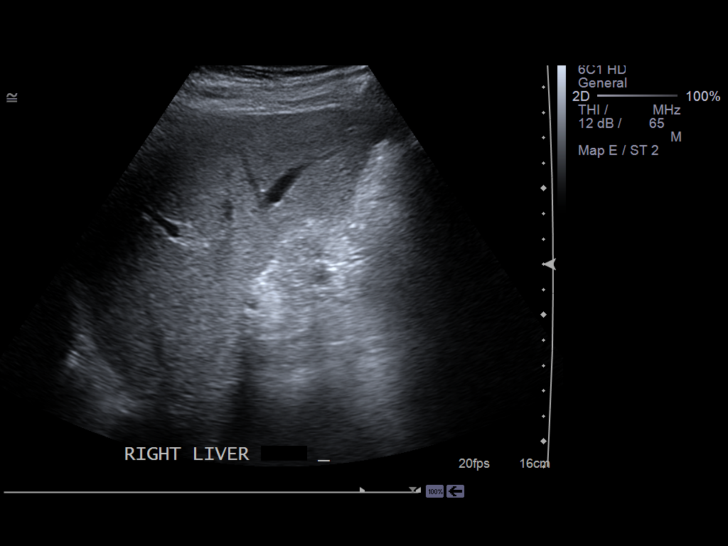
[im 30/80]
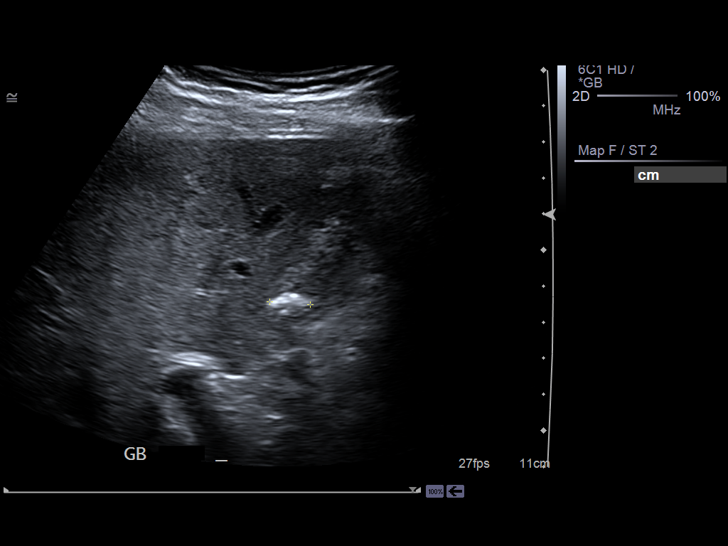
[im 37/80]
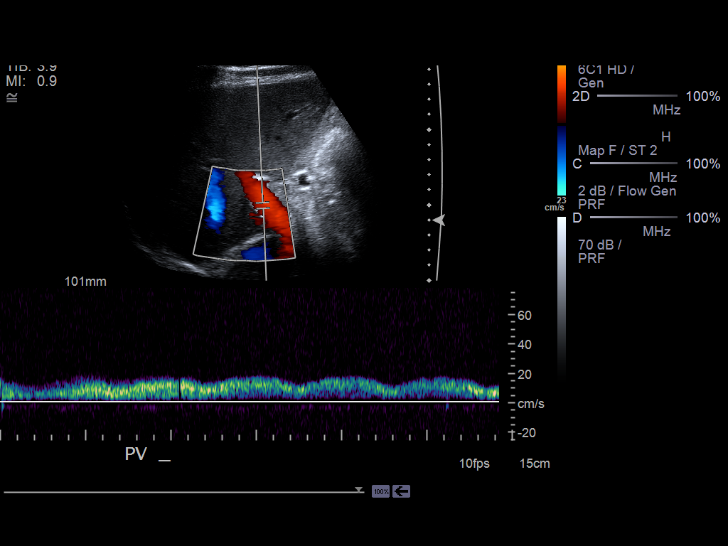
[im 43/80]
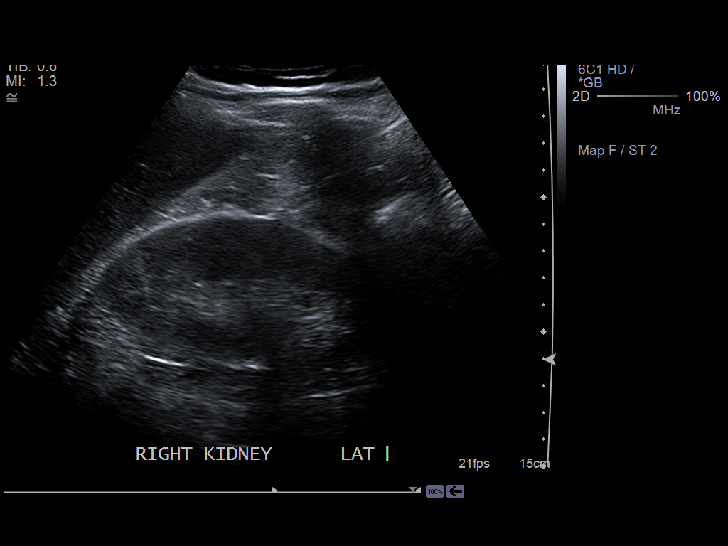
[im 50/80]
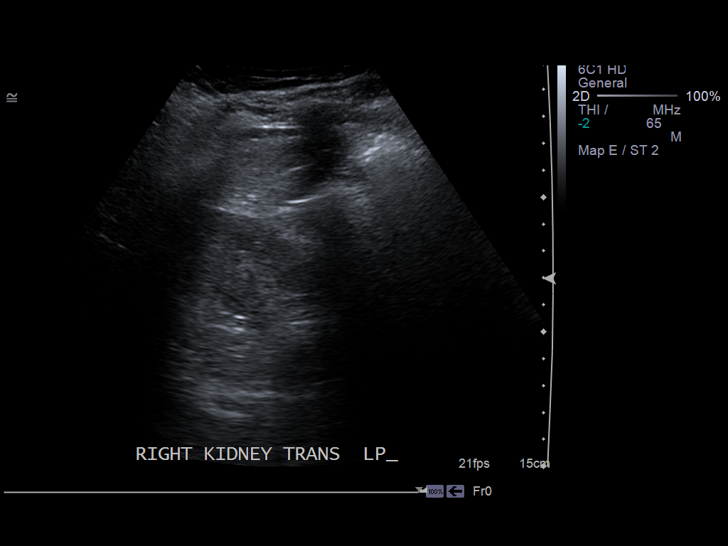
[im 53/80]
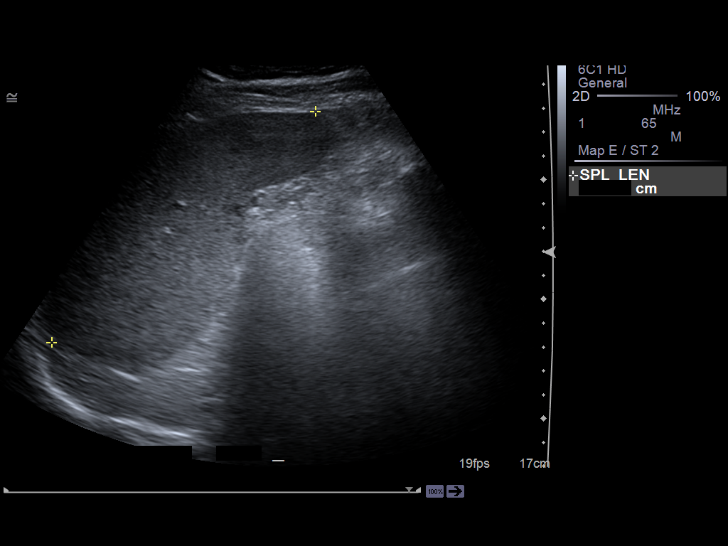
[im 60/80]
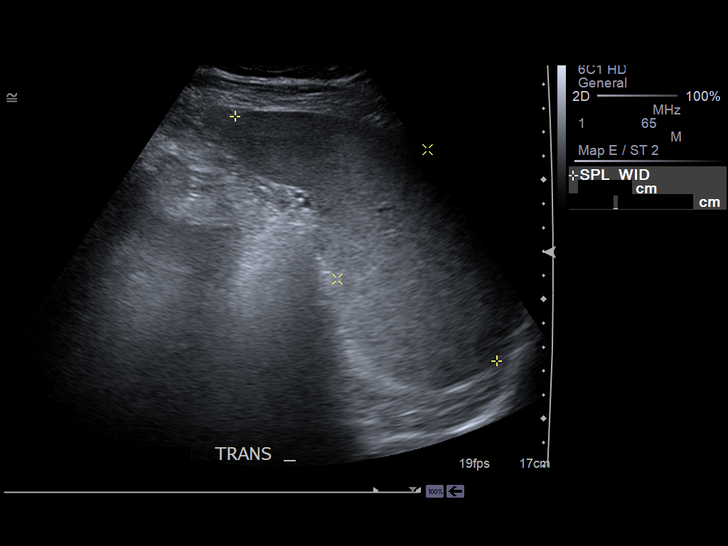
[im 66/80]
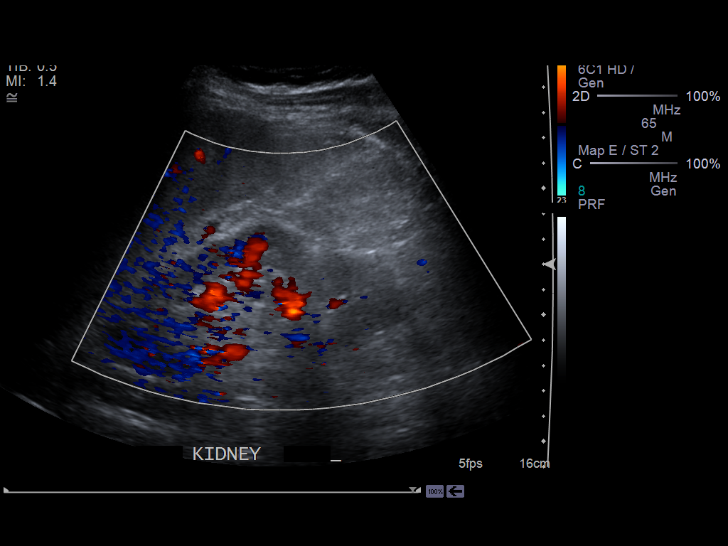
[im 73/80]
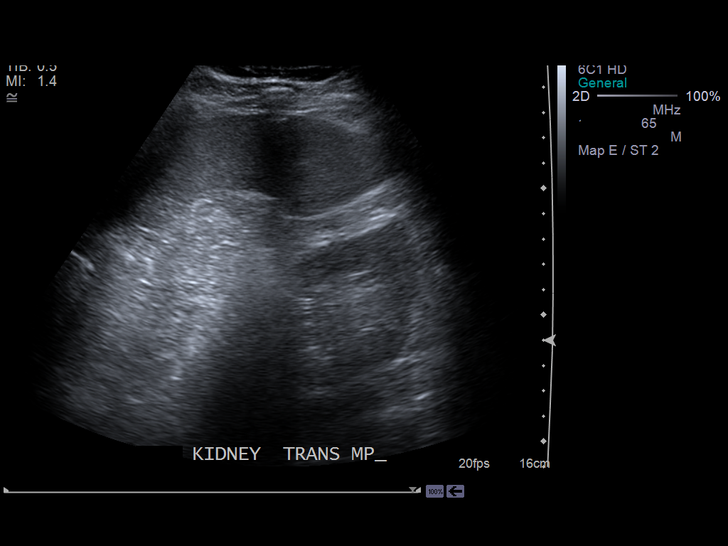
[im 80/80]
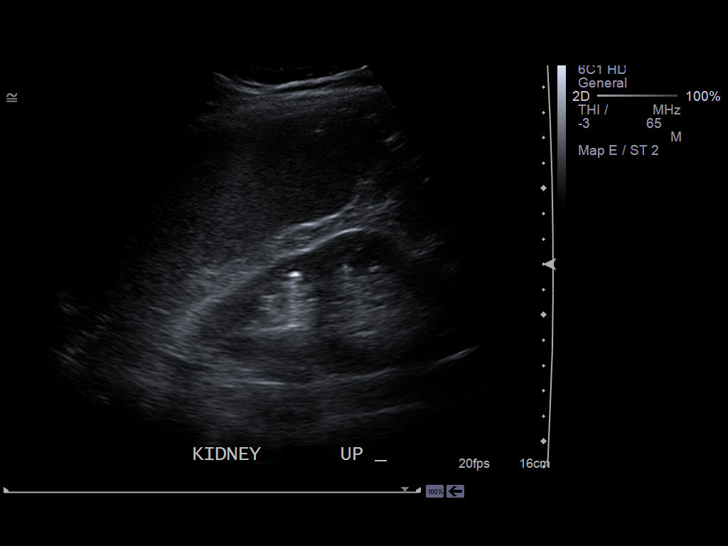

[14 of 25 positions shown; findings below may reference images not displayed]

PROCEDURE:     US  - US ABDOMEN LIMITED SURVEY  - May 19, 2012  [DATE]

RESULT:     A limited abdominal ultrasound was performed. The spleen is
mildly enlarged at 14.7 cm in greatest dimension. The right kidney is normal
in echotexture and contour measures 11.3 x 5.2 x 4.8 centimeters. The left
kidney is normal in contour and measures 12.6 x 5.4 x 5.2 centimeters. There
is an echogenic focus in the upper pole compatible with a stone. Incidental
note is made of a gallstone.
IMPRESSION: 1. There is likely a stone in the upper pole of the left kidney. No discrete
mass is demonstrated. There is no hydronephrosis.
2. There is mild splenomegaly.
3. Incidental note is made of a gallstone.

[REDACTED]

## 2014-08-07 ENCOUNTER — Emergency Department (HOSPITAL_COMMUNITY)
Admission: EM | Admit: 2014-08-07 | Discharge: 2014-08-07 | Disposition: A | Discharge status: Home / Self Care | Payer: BLUE CROSS/BLUE SHIELD | Attending: Emergency Medicine | Admitting: Emergency Medicine

## 2014-08-07 DIAGNOSIS — R51 Headache: Secondary | ICD-10-CM | POA: Diagnosis not present

## 2014-08-07 DIAGNOSIS — F329 Major depressive disorder, single episode, unspecified: Secondary | ICD-10-CM | POA: Diagnosis not present

## 2014-08-07 DIAGNOSIS — Z711 Person with feared health complaint in whom no diagnosis is made: Secondary | ICD-10-CM

## 2014-08-07 DIAGNOSIS — Z202 Contact with and (suspected) exposure to infections with a predominantly sexual mode of transmission: Secondary | ICD-10-CM | POA: Diagnosis not present

## 2014-08-07 DIAGNOSIS — Z79899 Other long term (current) drug therapy: Secondary | ICD-10-CM | POA: Diagnosis not present

## 2014-08-07 DIAGNOSIS — Z7982 Long term (current) use of aspirin: Secondary | ICD-10-CM | POA: Diagnosis not present

## 2014-08-07 DIAGNOSIS — R509 Fever, unspecified: Secondary | ICD-10-CM | POA: Diagnosis present

## 2014-08-07 DIAGNOSIS — N485 Ulcer of penis: Secondary | ICD-10-CM | POA: Insufficient documentation

## 2014-08-07 DIAGNOSIS — E785 Hyperlipidemia, unspecified: Secondary | ICD-10-CM | POA: Diagnosis not present

## 2014-08-07 DIAGNOSIS — I10 Essential (primary) hypertension: Secondary | ICD-10-CM | POA: Insufficient documentation

## 2014-08-07 DIAGNOSIS — Z792 Long term (current) use of antibiotics: Secondary | ICD-10-CM | POA: Diagnosis not present

## 2014-08-07 DIAGNOSIS — N509 Disorder of male genital organs, unspecified: Secondary | ICD-10-CM | POA: Insufficient documentation

## 2014-08-07 DIAGNOSIS — Z8673 Personal history of transient ischemic attack (TIA), and cerebral infarction without residual deficits: Secondary | ICD-10-CM | POA: Diagnosis not present

## 2014-08-07 DIAGNOSIS — N489 Disorder of penis, unspecified: Secondary | ICD-10-CM

## 2014-08-07 LAB — COMPREHENSIVE METABOLIC PANEL
ALBUMIN: 3.7 g/dL (ref 3.5–5.0)
ALT: 22 U/L (ref 17–63)
AST: 25 U/L (ref 15–41)
Alkaline Phosphatase: 90 U/L (ref 38–126)
Anion gap: 9 (ref 5–15)
BILIRUBIN TOTAL: 0.3 mg/dL (ref 0.3–1.2)
BUN: 13 mg/dL (ref 6–20)
CO2: 25 mmol/L (ref 22–32)
Calcium: 8.9 mg/dL (ref 8.9–10.3)
Chloride: 104 mmol/L (ref 101–111)
Creatinine, Ser: 0.8 mg/dL (ref 0.61–1.24)
GFR calc Af Amer: >60 mL/min (ref >60)
GFR calc non Af Amer: >60 mL/min (ref >60)
GLUCOSE: 108 mg/dL — AB (ref 65–99)
Potassium: 3.6 mmol/L (ref 3.5–5.1)
Sodium: 138 mmol/L (ref 135–145)
Total Protein: 7.3 g/dL (ref 6.5–8.1)

## 2014-08-07 LAB — URINALYSIS, ROUTINE W REFLEX MICROSCOPIC
Bilirubin Urine: NEGATIVE
Glucose, UA: NEGATIVE mg/dL
KETONES UR: NEGATIVE mg/dL
Leukocytes, UA: NEGATIVE
Nitrite: NEGATIVE
PH: 6 (ref 5.0–8.0)
Protein, ur: NEGATIVE mg/dL
Specific Gravity, Urine: 1.01 (ref 1.005–1.030)
UROBILINOGEN UA: 0.2 mg/dL (ref 0.0–1.0)

## 2014-08-07 LAB — CBC WITH DIFFERENTIAL/PLATELET
Basophils Absolute: 0.1 10*3/uL (ref 0.0–0.1)
Basophils Relative: 1 % (ref 0–1)
EOS ABS: 0.1 10*3/uL (ref 0.0–0.7)
EOS PCT: 1 % (ref 0–5)
HEMATOCRIT: 44.1 % (ref 39.0–52.0)
Hemoglobin: 15.4 g/dL (ref 13.0–17.0)
LYMPHS PCT: 29 % (ref 12–46)
Lymphs Abs: 2.4 10*3/uL (ref 0.7–4.0)
MCH: 38 pg — AB (ref 26.0–34.0)
MCHC: 34.9 g/dL (ref 30.0–36.0)
MCV: 108.9 fL — ABNORMAL HIGH (ref 78.0–100.0)
MONO ABS: 1.7 10*3/uL — AB (ref 0.1–1.0)
MONOS PCT: 20 % — AB (ref 3–12)
Neutro Abs: 4.1 10*3/uL (ref 1.7–7.7)
Neutrophils Relative %: 49 % (ref 43–77)
Platelets: 346 10*3/uL (ref 150–400)
RBC: 4.05 MIL/uL — ABNORMAL LOW (ref 4.22–5.81)
RDW: 13.6 % (ref 11.5–15.5)
WBC: 8.3 10*3/uL (ref 4.0–10.5)

## 2014-08-07 LAB — GRAM STAIN

## 2014-08-07 LAB — URINE MICROSCOPIC-ADD ON

## 2014-08-07 MED ORDER — CEFTRIAXONE SODIUM 250 MG IJ SOLR
250.0000 mg | Freq: Once | INTRAMUSCULAR | Status: AC
  Administered 2014-08-07: 250 mg via INTRAMUSCULAR
  Filled 2014-08-07: qty 250

## 2014-08-07 MED ORDER — METRONIDAZOLE 500 MG PO TABS
2000.0000 mg | ORAL_TABLET | Freq: Once | ORAL | Status: AC
  Administered 2014-08-07: 2000 mg via ORAL
  Filled 2014-08-07: qty 4

## 2014-08-07 MED ORDER — LIDOCAINE HCL (PF) 1 % IJ SOLN
0.9000 mL | Freq: Once | INTRAMUSCULAR | Status: AC
  Administered 2014-08-07: 0.9 mL via INTRADERMAL
  Filled 2014-08-07: qty 5

## 2014-08-07 MED ORDER — PENICILLIN G BENZATHINE 1200000 UNIT/2ML IM SUSP
2.4000 10*6.[IU] | Freq: Once | INTRAMUSCULAR | Status: AC
Start: 2014-08-07 — End: 2014-08-07
  Administered 2014-08-07: 2.4 10*6.[IU] via INTRAMUSCULAR
  Filled 2014-08-07: qty 4

## 2014-08-07 MED ORDER — ONDANSETRON 4 MG PO TBDP
4.0000 mg | ORAL_TABLET | Freq: Once | ORAL | Status: AC
  Administered 2014-08-07: 4 mg via ORAL
  Filled 2014-08-07: qty 1

## 2014-08-07 MED ORDER — AZITHROMYCIN 250 MG PO TABS
1000.0000 mg | ORAL_TABLET | Freq: Once | ORAL | Status: AC
  Administered 2014-08-07: 1000 mg via ORAL
  Filled 2014-08-07: qty 4
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
## 2014-08-08 LAB — GC/CHLAMYDIA PROBE AMP (~~LOC~~) NOT AT ARMC
Chlamydia: NEGATIVE
Neisseria Gonorrhea: NEGATIVE

## 2014-08-08 LAB — RPR: RPR: NONREACTIVE

## 2014-08-08 LAB — HIV ANTIBODY (ROUTINE TESTING W REFLEX): HIV Screen 4th Generation wRfx: NONREACTIVE

## 2014-08-09 LAB — URINE CULTURE: CULTURE: NO GROWTH

## 2014-08-14 ENCOUNTER — Encounter: Payer: Self-pay | Admitting: *Deleted

## 2014-09-13 ENCOUNTER — Encounter: Payer: Self-pay | Admitting: Internal Medicine

## 2016-05-31 DIAGNOSIS — I10 Essential (primary) hypertension: Secondary | ICD-10-CM | POA: Insufficient documentation

## 2016-05-31 DIAGNOSIS — E7800 Pure hypercholesterolemia, unspecified: Secondary | ICD-10-CM | POA: Insufficient documentation

## 2016-05-31 HISTORY — DX: Essential (primary) hypertension: I10

## 2016-06-04 ENCOUNTER — Other Ambulatory Visit: Payer: Self-pay | Admitting: Internal Medicine

## 2016-06-09 DIAGNOSIS — Z9081 Acquired absence of spleen: Secondary | ICD-10-CM | POA: Insufficient documentation

## 2016-06-09 DIAGNOSIS — M21372 Foot drop, left foot: Secondary | ICD-10-CM | POA: Insufficient documentation

## 2016-06-09 DIAGNOSIS — M21379 Foot drop, unspecified foot: Secondary | ICD-10-CM | POA: Insufficient documentation

## 2016-06-09 DIAGNOSIS — M79606 Pain in leg, unspecified: Secondary | ICD-10-CM

## 2016-06-09 HISTORY — DX: Pain in leg, unspecified: M79.606

## 2016-07-27 DIAGNOSIS — S82409A Unspecified fracture of shaft of unspecified fibula, initial encounter for closed fracture: Secondary | ICD-10-CM | POA: Insufficient documentation

## 2016-07-27 DIAGNOSIS — E669 Obesity, unspecified: Secondary | ICD-10-CM | POA: Insufficient documentation

## 2016-09-02 ENCOUNTER — Observation Stay
Admission: EM | Admit: 2016-09-02 | Discharge: 2016-09-05 | Disposition: A | Discharge status: Home / Self Care | Payer: BC Managed Care – PPO | Attending: Internal Medicine | Admitting: Internal Medicine

## 2016-09-02 ENCOUNTER — Emergency Department: Payer: BC Managed Care – PPO

## 2016-09-02 DIAGNOSIS — Z8673 Personal history of transient ischemic attack (TIA), and cerebral infarction without residual deficits: Secondary | ICD-10-CM | POA: Insufficient documentation

## 2016-09-02 DIAGNOSIS — E869 Volume depletion, unspecified: Secondary | ICD-10-CM | POA: Insufficient documentation

## 2016-09-02 DIAGNOSIS — B349 Viral infection, unspecified: Secondary | ICD-10-CM | POA: Insufficient documentation

## 2016-09-02 DIAGNOSIS — I10 Essential (primary) hypertension: Secondary | ICD-10-CM | POA: Insufficient documentation

## 2016-09-02 DIAGNOSIS — R55 Syncope and collapse: Secondary | ICD-10-CM | POA: Insufficient documentation

## 2016-09-02 DIAGNOSIS — R29898 Other symptoms and signs involving the musculoskeletal system: Secondary | ICD-10-CM

## 2016-09-02 DIAGNOSIS — I160 Hypertensive urgency: Secondary | ICD-10-CM | POA: Insufficient documentation

## 2016-09-02 DIAGNOSIS — R112 Nausea with vomiting, unspecified: Principal | ICD-10-CM | POA: Diagnosis present

## 2016-09-02 DIAGNOSIS — E785 Hyperlipidemia, unspecified: Secondary | ICD-10-CM | POA: Insufficient documentation

## 2016-09-02 DIAGNOSIS — Z9081 Acquired absence of spleen: Secondary | ICD-10-CM | POA: Insufficient documentation

## 2016-09-02 DIAGNOSIS — D45 Polycythemia vera: Secondary | ICD-10-CM | POA: Insufficient documentation

## 2016-09-02 HISTORY — DX: Polycythemia vera: D45

## 2016-09-02 HISTORY — DX: Transient cerebral ischemic attack, unspecified: G45.9

## 2016-09-02 HISTORY — DX: Essential (primary) hypertension: I10

## 2016-09-02 LAB — CBC AND DIFFERENTIAL
Absolute NRBC: 0 10*3/uL
Basophils Absolute Automated: 0.04 10*3/uL (ref 0.00–0.20)
Basophils Automated: 0.3 %
Eosinophils Absolute Automated: 0 10*3/uL (ref 0.00–0.70)
Eosinophils Automated: 0 %
Hematocrit: 46.4 % (ref 42.0–52.0)
Hgb: 16.8 g/dL (ref 13.0–17.0)
Immature Granulocytes Absolute: 0.06 10*3/uL — ABNORMAL HIGH
Immature Granulocytes: 0.5 %
Lymphocytes Absolute Automated: 0.96 10*3/uL (ref 0.50–4.40)
Lymphocytes Automated: 8.1 %
MCH: 39.8 pg — ABNORMAL HIGH (ref 28.0–32.0)
MCHC: 36.2 g/dL — ABNORMAL HIGH (ref 32.0–36.0)
MCV: 110 fL — ABNORMAL HIGH (ref 80.0–100.0)
MPV: 8.6 fL — ABNORMAL LOW (ref 9.4–12.3)
Monocytes Absolute Automated: 0.54 10*3/uL (ref 0.00–1.20)
Monocytes: 4.5 %
Neutrophils Absolute: 10.3 10*3/uL — ABNORMAL HIGH (ref 1.80–8.10)
Neutrophils: 86.6 %
Nucleated RBC: 0 /100 WBC (ref 0.0–1.0)
Platelets: 319 10*3/uL (ref 140–400)
RBC: 4.22 10*6/uL — ABNORMAL LOW (ref 4.70–6.00)
RDW: 14 % (ref 12–15)
WBC: 11.9 10*3/uL — ABNORMAL HIGH (ref 3.50–10.80)

## 2016-09-02 LAB — COMPREHENSIVE METABOLIC PANEL
ALT: 16 U/L (ref 0–55)
AST (SGOT): 23 U/L (ref 5–34)
Albumin/Globulin Ratio: 1.2 (ref 0.9–2.2)
Albumin: 4 g/dL (ref 3.5–5.0)
Alkaline Phosphatase: 76 U/L (ref 38–106)
Anion Gap: 11 (ref 5.0–15.0)
BUN: 6 mg/dL — ABNORMAL LOW (ref 9.0–28.0)
Bilirubin, Total: 1.3 mg/dL — ABNORMAL HIGH (ref 0.2–1.2)
CO2: 22 mEq/L (ref 22–29)
Calcium: 8.7 mg/dL (ref 8.5–10.5)
Chloride: 104 mEq/L (ref 100–111)
Creatinine: 0.9 mg/dL (ref 0.7–1.3)
Globulin: 3.4 g/dL (ref 2.0–3.6)
Glucose: 127 mg/dL — ABNORMAL HIGH (ref 70–100)
Potassium: 3.9 mEq/L (ref 3.5–5.1)
Protein, Total: 7.4 g/dL (ref 6.0–8.3)
Sodium: 137 mEq/L (ref 136–145)

## 2016-09-02 LAB — TROPONIN I: Troponin I: <0.01 ng/mL (ref 0.00–0.09)

## 2016-09-02 LAB — LIPASE: Lipase: 22 U/L (ref 8–78)

## 2016-09-02 LAB — HEMOLYSIS INDEX: Hemolysis Index: 17 (ref 0–18)

## 2016-09-02 LAB — GFR: EGFR: >60

## 2016-09-02 MED ORDER — TRAZODONE HCL 50 MG PO TABS
50.0000 mg | ORAL_TABLET | Freq: Every evening | ORAL | Status: DC | PRN
Start: 2016-09-02 — End: 2016-09-05
  Administered 2016-09-02 – 2016-09-04 (×3): 50 mg via ORAL
  Filled 2016-09-02 (×3): qty 1

## 2016-09-02 MED ORDER — SODIUM CHLORIDE 0.9 % IV BOLUS
1000.0000 mL | Freq: Once | INTRAVENOUS | Status: AC
Start: 2016-09-02 — End: 2016-09-02
  Administered 2016-09-02: 1000 mL via INTRAVENOUS

## 2016-09-02 MED ORDER — ONDANSETRON HCL 4 MG/2ML IJ SOLN
4.0000 mg | INTRAMUSCULAR | Status: DC | PRN
Start: 2016-09-02 — End: 2016-09-05

## 2016-09-02 MED ORDER — DEXTROSE-SODIUM CHLORIDE 5-0.45 % IV SOLN
INTRAVENOUS | Status: DC
Start: 2016-09-02 — End: 2016-09-05

## 2016-09-02 MED ORDER — LABETALOL HCL 5 MG/ML IV SOLN
10.0000 mg | Freq: Once | INTRAVENOUS | Status: AC
Start: 2016-09-02 — End: 2016-09-02
  Administered 2016-09-02: 10 mg via INTRAVENOUS
  Filled 2016-09-02: qty 20

## 2016-09-02 MED ORDER — ALUM & MAG HYDROXIDE-SIMETH 200-200-20 MG/5ML PO SUSP
30.0000 mL | Freq: Four times a day (QID) | ORAL | Status: DC | PRN
Start: 2016-09-02 — End: 2016-09-05

## 2016-09-02 MED ORDER — ATENOLOL 50 MG PO TABS
50.0000 mg | ORAL_TABLET | Freq: Every day | ORAL | Status: DC
Start: 2016-09-02 — End: 2016-09-05
  Administered 2016-09-02 – 2016-09-05 (×4): 50 mg via ORAL
  Filled 2016-09-02 (×4): qty 1

## 2016-09-02 MED ORDER — HYDROXYUREA 500 MG PO CAPS
500.0000 mg | ORAL_CAPSULE | Freq: Every day | ORAL | Status: DC
Start: 2016-09-02 — End: 2016-09-02

## 2016-09-02 MED ORDER — HYDROXYUREA 500 MG PO CAPS
500.0000 mg | ORAL_CAPSULE | Freq: Every day | ORAL | Status: DC
Start: 2016-09-02 — End: 2016-09-05
  Administered 2016-09-02 – 2016-09-05 (×4): 500 mg via ORAL
  Filled 2016-09-02 (×4): qty 1

## 2016-09-02 MED ORDER — LISINOPRIL 20 MG PO TABS
20.0000 mg | ORAL_TABLET | Freq: Two times a day (BID) | ORAL | Status: DC
Start: 2016-09-02 — End: 2016-09-05
  Administered 2016-09-02 – 2016-09-05 (×6): 20 mg via ORAL
  Filled 2016-09-02 (×6): qty 1

## 2016-09-02 MED ORDER — METOCLOPRAMIDE HCL 5 MG/ML IJ SOLN
10.0000 mg | Freq: Once | INTRAMUSCULAR | Status: AC
Start: 2016-09-02 — End: 2016-09-02
  Administered 2016-09-02: 10 mg via INTRAVENOUS
  Filled 2016-09-02: qty 2

## 2016-09-02 MED ORDER — FAMOTIDINE 10 MG/ML IV SOLN (WRAP)
20.0000 mg | Freq: Two times a day (BID) | INTRAVENOUS | Status: DC
Start: 2016-09-02 — End: 2016-09-05
  Administered 2016-09-02 – 2016-09-03 (×3): 20 mg via INTRAVENOUS
  Filled 2016-09-02 (×5): qty 2

## 2016-09-02 MED ORDER — HYDRALAZINE HCL 20 MG/ML IJ SOLN
20.0000 mg | INTRAMUSCULAR | Status: DC | PRN
Start: 2016-09-02 — End: 2016-09-05
  Administered 2016-09-02 – 2016-09-04 (×3): 20 mg via INTRAVENOUS
  Filled 2016-09-02 (×4): qty 1

## 2016-09-02 MED ORDER — ENOXAPARIN SODIUM 40 MG/0.4ML SC SOLN
40.0000 mg | Freq: Every day | SUBCUTANEOUS | Status: DC
Start: 2016-09-02 — End: 2016-09-05
  Administered 2016-09-02 – 2016-09-05 (×4): 40 mg via SUBCUTANEOUS
  Filled 2016-09-02 (×4): qty 0.4

## 2016-09-02 NOTE — Progress Notes (Signed)
Patient arrived on unit with elevated blood pressure of 170/111 at 1620.  Last blood pressure before admission was taken at  1332 and was 186/118.  Given scheduled atenolol and lisinopril.  Patient describes new dizziness symptoms that began today.  Called MD and he stated patient needs to be transferred to a unit where IV blood pressure meds can be administered.  Report given to RN Kim on unit 25.

## 2016-09-02 NOTE — ED Triage Notes (Signed)
[facility] EMERGENCY DEPARTMENT TRIAGE NOTE     Timothy Brandt is a 56 y.o. male who presents to the ED with a chief complaint of: Weakness     Onset: since last night   Pt reports " not feeling well " last night. While attempting to ambulate to bathroom pt had 1 episode of vomiting followed by a syncope episode that patient states lasted about 2 minutes. Pt then ambulated to back to bed, woke up this morning feeling dizziness and nauseous.  Hx of HTN and Polycythemia vera.     LOC: Patient is alert, oriented, answering questions and speaking full sentences.      Vitals: Blood pressure (!) 198/125, pulse 88, temperature 98 F (36.7 C), temperature source Oral, resp. rate 18, height 6\' 1"  (1.854 m), weight 106.6 kg, SpO2 99 %.    Meds taken prior to ED arrival: None

## 2016-09-02 NOTE — ED Notes (Signed)
NURSING NOTE FOR THE RECEIVING INPATIENT NURSE   ED NURSE Valetta Mulroy   [place] [address]   ADMISSION INFORMATION   Timothy Brandt is a 56 y.o. male admitted with a diagnosis of:    No diagnosis found.    Holding Orders: Yes    Isolation: None   NURSING CARE   LOC lethargic   ADL ADLs:            Independent with all ADLs  Ambulation:  mild difficulty due to lightheadedness and dizziness   Pertinent Information and/or   Safety Concerns Hx of polycythemia, ran out of hydrea today. Dose in ED. Vomiting x24 hours, elevated BP. Pt hx of HTN, poorly controlled, pt states does not have a cardiologist in area.       VITAL SIGNS     Vitals:    09/02/16 1332   BP: (!) 186/118   Pulse: 99   Resp:    Temp:    SpO2: 95%        IV LINES     IV Catheter Size: 20 g    Peripheral IV 09/02/16 Left Antecubital (Active)   Site Assessment Dry;Clean;Intact 09/02/2016 10:29 AM   Line Status Saline Locked;Infusing;Capped 09/02/2016 10:29 AM   Dressing Status Clean;Dry;Intact 09/02/2016 10:29 AM   Number of days: 0          LAB RESULTS     Labs Reviewed   COMPREHENSIVE METABOLIC PANEL - Abnormal; Notable for the following:        Result Value    Glucose 127 (*)     BUN 6.0 (*)     Bilirubin, Total 1.3 (*)     All other components within normal limits   CBC AND DIFFERENTIAL - Abnormal; Notable for the following:     WBC 11.90 (*)     RBC 4.22 (*)     MCV 110.0 (*)     MCH 39.8 (*)     MCHC 36.2 (*)     MPV 8.6 (*)     Neutrophils Absolute 10.30 (*)     Absolute Immature Granulocyte 0.06 (*)     All other components within normal limits   TROPONIN I   HEMOLYSIS INDEX   GFR   LIPASE
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
## 2016-09-02 NOTE — ED Notes (Signed)
Bed: GR8  Expected date:   Expected time:   Means of arrival:   Comments:  Medic 206
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
## 2016-09-02 NOTE — Progress Notes (Signed)
Rec'vd pt from room 2306, alert, oriented x4, room air, oriented to room policies and procedures, placed on fall prec d/t complaint of "dizziness and lightheadedness", bed alarm set, up with assistance with walker,

## 2016-09-02 NOTE — ED Notes (Signed)
As per [facility] patient received 4mg  Zofran and 1L NS KVO PTA

## 2016-09-03 LAB — ECG 12-LEAD
Atrial Rate: 91 {beats}/min
P Axis: 67 degrees
P-R Interval: 202 ms
Q-T Interval: 376 ms
QRS Duration: 88 ms
QTC Calculation (Bezet): 462 ms
R Axis: -44 degrees
T Axis: 52 degrees
Ventricular Rate: 91 {beats}/min
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
## 2016-09-03 NOTE — Plan of Care (Signed)
Problem: Nutrition  Goal: Nutritional intake is adequate  Outcome: Progressing   09/03/16 1154   Goal/Interventions addressed this shift   Nutritional intake is adequate Assess anorexia, appetite, and amount of meal/food tolerated;Include patient/patient care companion in decisions related to nutrition   Tolerating clear liquid well; no n/v; advanced to full liquid; if tolerates well, will advance diet as per MD; voiding well

## 2016-09-03 NOTE — Plan of Care (Addendum)
Problem: Safety  Goal: Patient will be free from injury during hospitalization  Outcome: Progressing   09/03/16 0120   Goal/Interventions addressed this shift   Patient will be free from injury during hospitalization  Assess patient's risk for falls and implement fall prevention plan of care per policy;Use appropriate transfer methods;Provide and maintain safe environment;Ensure appropriate safety devices are available at the bedside;Include patient/ family/ care giver in decisions related to safety;Hourly rounding     Pt AAOx4. I/O monitored. Hydralazine 20mg  IV due to BP 189/117.  Midnight vitals not done according to Dr. Marney Doctor pt wish to sleep. Last BP 170/101  monitored at 2144. Pt asymptomatic. Will monitored vitals at morning or whenever pt wakes up. Pt states 1 time small amount of vomite. Unable to assist. No any acute distress.  Pt learning abilities have been assessed. Plan of care was discussed with pt and verbalized with understanding of disease process, treatment,medicine and consequences of noncompliance. All question and concerned were addressed. Safety precaution measured. Hourly rounded.

## 2016-09-03 NOTE — Plan of Care (Signed)
Problem: Safety  Goal: Patient will be free from injury during hospitalization  Outcome: Progressing   09/03/16 0120   Goal/Interventions addressed this shift   Patient will be free from injury during hospitalization  Assess patient's risk for falls and implement fall prevention plan of care per policy;Use appropriate transfer methods;Provide and maintain safe environment;Ensure appropriate safety devices are available at the bedside;Include patient/ family/ care giver in decisions related to safety;Hourly rounding   Wobbly on feet at times due to dizziness; ambulating in room with walker; call bell within reach and encouraged pt to call for assistance when in need; pt states his understanding; hourly rounded on pt    Problem: Hemodynamic Status: Cardiac  Goal: Stable vital signs and fluid balance  Outcome: Progressing   09/03/16 1132   Goal/Interventions addressed this shift   Stable vital signs and fluid balance Monitor/assess vital signs and telemetry per unit protocol;Weigh on admission and record weight daily;Assess signs and symptoms associated with cardiac rhythm changes;Monitor intake/output per unit protocol and/or LIP order;Monitor lab values;Monitor for leg swelling/edema and report to LIP if abnormal   BP under control; ortho hypotension negative; will continue to monitor BP    Problem: Impaired Mobility  Goal: Mobility/Activity is maintained at optimal level for patient  Outcome: Progressing  Still having dizziness when ambulating and change position; MD aware; could be due to viral infection and inner ear problem; pt refuses to have med for that and wants to wait and see; MD agreed

## 2016-09-04 MED ORDER — AMLODIPINE BESYLATE 5 MG PO TABS
10.0000 mg | ORAL_TABLET | Freq: Every day | ORAL | Status: DC
Start: 2016-09-04 — End: 2016-09-05
  Administered 2016-09-04 – 2016-09-05 (×2): 10 mg via ORAL
  Filled 2016-09-04 (×2): qty 2

## 2016-09-04 MED ORDER — ACETAMINOPHEN 325 MG PO TABS
650.0000 mg | ORAL_TABLET | Freq: Four times a day (QID) | ORAL | Status: DC | PRN
Start: 2016-09-04 — End: 2016-09-05
  Administered 2016-09-04: 650 mg via ORAL
  Filled 2016-09-04: qty 2

## 2016-09-04 NOTE — PT Eval Note (Addendum)
[facility]  Physical Therapy Attempt Note    Patient:  Timothy Brandt MRN#:  [id number]  Unit:  [facility] Room/Bed:  V4098/J1914-N      Physical Therapy evaluation attempted on 09/04/2016 7:41 AM patient not appropriate for therapy evalulation at this time 2/2 pt with elevated BP at rest (171/105). RN was aware.  Will follow up when appropriate as schedule permits.       [place], [place], [place] [address]  09/04/2016  7:42 AM

## 2016-09-04 NOTE — Progress Notes (Signed)
INTERNAL MEDICINE PROGRESS NOTE    Date Time: 09/04/16 3:15 PM  Patient Name: Timothy Brandt, Timothy Brandt      Problem List:      Intractable Nausea and Vomiting   Dizziness   Viral Syndrome   Hypertensive urgency   Polycythemia Vera     Plan:      Start amlodipine in addition to atenolol and lisinopril to optimize blood pressure control   Tolerating clear liquids, will advance to full liquids and eventually regular diet   We will stop IV fluids   Will encourage patient to get up and walk around   GI prophylaxis   DVT prophylaxis   Discussed plan of care with patient    Discussed plan of care with nurses.   Disposition once his blood pressure is optimal    Subjective:     Patient was admitted on 09/02/2016 with intractable nausea and vomiting as well as dizziness.   EMR was reviewed and patient was seen with his nurse.  Patient is very concerned about his blood pressure.   I indicated that we need to add additional agent because his blood pressure is not optimal.  Even before  He came to the hospital his systolic blood pressure was running around 160-170.  He has been on atenolol  And lisinopril only.  His dose of lisinopril was increased and blood pressure is not optimal yet.  I recommended  To add amlodipine.  Side effects was explained to patient and patient would like to go ahead and start.     Medications:   Medications:   Scheduled Meds: PRN Meds:        amLODIPine 10 mg Oral Daily   atenolol 50 mg Oral Daily   enoxaparin 40 mg Subcutaneous Daily   famotidine 20 mg Intravenous Q12H SCH   hydroxyurea 500 mg Oral Daily   lisinopril 20 mg Oral BID       Continuous Infusions:  . dextrose 5 % and 0.45% NaCl 75 mL/hr at 09/03/16 2244          alum & mag hydroxide-simethicone 30 mL Q6H PRN   hydrALAZINE 20 mg Q4H PRN   ondansetron 4 mg Q4H PRN   traZODone 50 mg QHS PRN           Review of Systems:     General: negative for -  fever, chills, malaise  HEENT: negative for - headache, dysphagia, odynophagia   Pulmonary:  negative for - cough,  wheezing  Cardiovascular: negative for - pain, dyspnea, orthopnea,   Gastrointestinal: negative for - pain, nausea , vomiting, diarrhea  Genitourinary: negative for - dysuria, frequency, urgency  Musculoskeletal : negative for - joint pain,  muscle pain   Neurologic : positive for dizziness    Physical Exam:     VITAL SIGNS   Temp:  [96.1 F (35.6 C)-98.5 F (36.9 C)] 96.4 F (35.8 C)  Heart Rate:  [69-97] 69  Resp Rate:  [18] 18  BP: (148-195)/(93-110) 169/105  No Data Recorded  SpO2: 94 %    Intake/Output Summary (Last 24 hours) at 09/04/16 1515  Last data filed at 09/04/16 0800   Gross per 24 hour   Intake              710 ml   Output              880 ml   Net             -170 ml  General: Awake, alert, in no acute distress.   Heent: pinkish conjunctiva, anicteric sclera, moist mucus membrane   Cvs: S1 & S 2 well heard, regular rate and rhythm   Chest: Clear to auscultation   Abdomen: Soft, non-tender, active bowel sounds.   Ext : no cyanosis, no edema   CNS: Alert, follows command, no focal deficits    Laboratory Results:     CHEMISTRY:     Recent Labs  Lab 09/02/16  1117   Glucose 127*   BUN 6.0*   Creatinine 0.9   Calcium 8.7   Sodium 137   Potassium 3.9   Chloride 104   CO2 22   Albumin 4.0   AST (SGOT) 23   ALT 16   Bilirubin, Total 1.3*   Alkaline Phosphatase 76       Recent Labs  Lab 09/02/16  1117   Troponin I <0.01       HEMATOLOGY:    Recent Labs  Lab 09/02/16  1117   WBC 11.90*   Hgb 16.8   Hematocrit 46.4   MCV 110.0*   MCH 39.8*   MCHC 36.2*   Platelets 319       Radiology Results:   Radiological Procedure reviewed.  Ct Head Wo Contrast    Result Date: 09/02/2016   No acute intracranial process. Sandie Ano, MD 09/02/2016 12:24 PM    Chest Ap Portable    Result Date: 09/02/2016   Mild interstitial edema. Sandie Ano, MD 09/02/2016 12:02 PM      Signed by: Ledora Bottcher M.D.  Spectra Link: [id number]  Office Phone Number : [phone]

## 2016-09-04 NOTE — UM Notes (Signed)
BCBS OUT OF STATES  09/04/16 CONCURRENT REVIEW    09/04/16 0849 -- -- -- 85 -- -- --  169/96   09/04/16 0735 --  96.1 F (35.6 C) Oral 74 95 % Monitor 18  171/105     MEDS  Atenolol 50mg  po qday  D5 1/2nacl@ 43m/hr  lovenox 40mg  subq qday  Hydrea 500mg  po qday  lisinopril 20mg  po bid      MD note pending.  Albertina Senegal Deitrick Ferreri  Utilization Review Case Manager RN (PRN weekends only)  [facility]   Main number [phone]   Fax [phone]   Direct Number [phone]  (payers and providers only.Please call main number regarding follow up during the weekdays.)

## 2016-09-04 NOTE — Plan of Care (Addendum)
Problem: Nutrition  Goal: Nutritional intake is adequate  Outcome: Progressing  Took over this patient at MN today. Was asleep till morning.  Per instructions of MD and RN's report, patient should be left asleep, so vital signs and lab draws were not done. No vomiting noted since MN, on mechanical soft diet, tolerated.   Woke up around 0645 hrs, explained I need to take VS and fix his telemetry  Monitor. Agreed. Went to the bathroom patient is limping on the left foot. PT has been ordered. Uses walker. Bed linens changed. Orthostatic BP has been taken and recorded.

## 2016-09-04 NOTE — Plan of Care (Signed)
Problem: Pain  Goal: Pain at adequate level as identified by patient  Outcome: Progressing   09/04/16 1738   Goal/Interventions addressed this shift   Pain at adequate level as identified by patient Identify patient comfort function goal;Assess for risk of opioid induced respiratory depression, including snoring/sleep apnea. Alert healthcare team of risk factors identified.;Assess pain on admission, during daily assessment and/or before any "as needed" intervention(s);Reassess pain within 30-60 minutes of any procedure/intervention, per Pain Assessment, Intervention, Reassessment (AIR) Cycle;Evaluate if patient comfort function goal is met;Offer non-pharmacological pain management interventions;Consult/collaborate with Physical Therapy, Occupational Therapy, and/or Speech Therapy;Include patient/patient care companion in decisions related to pain management as needed;Evaluate patient's satisfaction with pain management progress       Problem: Moderate/High Fall Risk Score >5  Goal: Patient will remain free of falls  Outcome: Progressing   09/04/16 0800   OTHER   Moderate Risk (6-13) MOD-([facility]H Only) Apply yellow "Fall Risk" arm band;MOD-([facility] Only) Yellow slippers       Problem: Hemodynamic Status: Cardiac  Goal: Stable vital signs and fluid balance  Outcome: Progressing   09/04/16 1738   Goal/Interventions addressed this shift   Stable vital signs and fluid balance Monitor/assess vital signs and telemetry per unit protocol;Assess signs and symptoms associated with cardiac rhythm changes;Monitor lab values;Monitor intake/output per unit protocol and/or LIP order;Monitor for leg swelling/edema and report to LIP if abnormal;Weigh on admission and record weight daily       Comments: Pt is A&Ox4. Pt reports improving dizziness, denies chest pain, palpitation, or any discomfort except c/o headache. Tylenol order obtained and given. VSS except persistent elevated BP. Prn hydralazine given. Started on Norvasc 10 mg. All  scheduled antihypertensive meds given. Will continue to monitor pt

## 2016-09-05 LAB — BASIC METABOLIC PANEL
Anion Gap: 12 (ref 5.0–15.0)
BUN: 17 mg/dL (ref 9.0–28.0)
CO2: 21 mEq/L — ABNORMAL LOW (ref 22–29)
Calcium: 9.5 mg/dL (ref 8.5–10.5)
Chloride: 104 mEq/L (ref 100–111)
Creatinine: 1.2 mg/dL (ref 0.7–1.3)
Glucose: 115 mg/dL — ABNORMAL HIGH (ref 70–100)
Potassium: 4.1 mEq/L (ref 3.5–5.1)
Sodium: 137 mEq/L (ref 136–145)

## 2016-09-05 LAB — CBC
Absolute NRBC: 0 10*3/uL
Hematocrit: 50.7 % (ref 42.0–52.0)
Hgb: 18.2 g/dL — ABNORMAL HIGH (ref 13.0–17.0)
MCH: 39.8 pg — ABNORMAL HIGH (ref 28.0–32.0)
MCHC: 35.9 g/dL (ref 32.0–36.0)
MCV: 110.9 fL — ABNORMAL HIGH (ref 80.0–100.0)
MPV: 8.9 fL — ABNORMAL LOW (ref 9.4–12.3)
Nucleated RBC: 0 /100 WBC (ref 0.0–1.0)
Platelets: 325 10*3/uL (ref 140–400)
RBC: 4.57 10*6/uL — ABNORMAL LOW (ref 4.70–6.00)
RDW: 14 % (ref 12–15)
WBC: 10.7 10*3/uL (ref 3.50–10.80)

## 2016-09-05 LAB — LIPID PANEL
Cholesterol / HDL Ratio: 7.2
Cholesterol: 279 mg/dL — ABNORMAL HIGH (ref 0–199)
HDL: 39 mg/dL — ABNORMAL LOW (ref 40–9999)
LDL Calculated: 188 mg/dL — ABNORMAL HIGH (ref 0–99)
Triglycerides: 258 mg/dL — ABNORMAL HIGH (ref 34–149)
VLDL Calculated: 52 mg/dL — ABNORMAL HIGH (ref 10–40)

## 2016-09-05 LAB — HEMOLYSIS INDEX
Hemolysis Index: 135 — ABNORMAL HIGH (ref 0–18)
Hemolysis Index: 57 — ABNORMAL HIGH (ref 0–18)

## 2016-09-05 LAB — GFR: EGFR: >60

## 2016-09-05 MED ORDER — AMLODIPINE BESYLATE 10 MG PO TABS
10.0000 mg | ORAL_TABLET | Freq: Every day | ORAL | 0 refills | Status: AC
Start: 2016-09-06 — End: ?

## 2016-09-05 MED ORDER — HYDROXYUREA 500 MG PO CAPS
500.0000 mg | ORAL_CAPSULE | Freq: Every day | ORAL | 0 refills | Status: AC
Start: 2016-09-05 — End: ?

## 2016-09-05 MED ORDER — ATENOLOL 50 MG PO TABS
50.0000 mg | ORAL_TABLET | Freq: Every day | ORAL | 0 refills | Status: AC
Start: 2016-09-05 — End: ?

## 2016-09-05 MED ORDER — LISINOPRIL 20 MG PO TABS
20.0000 mg | ORAL_TABLET | Freq: Two times a day (BID) | ORAL | 0 refills | Status: AC
Start: 2016-09-05 — End: ?

## 2016-09-05 NOTE — Progress Notes (Addendum)
D/c home self-care. HH PT/OT was recommended. CM left VM for [facility] to follows tomorrow. Pt lives in a hotel. Bedside nurse to get [place] address from the pt.     Pt's [place] address given by the pt:   [address] [place] [address]    Garry Heater, RN MSN  Clinical Care Manager  [phone]       09/05/16 1408   Discharge Disposition   Patient preference/choice provided? Yes   Physical Discharge Disposition Home with Needs   Name of Home Health Agency Other (comment box)  (TBD)   Mode of Transportation Car   Patient/Family/POA notified of transfer plan Yes   Patient agreeable to discharge plan/expected d/c date? Yes   Family/POA agreeable to discharge plan/expected d/c date? Yes   Bedside nurse notified of transport plan? Yes   CM Interventions   Follow up appointment scheduled? No   Reason no follow up scheduled? Family to schedule   Referral made for home health RN visit? Does not meet home bound criteria   Multidisciplinary rounds/family meeting before d/c? No   Medicare Checklist   Is this a Medicare patient? No

## 2016-09-05 NOTE — Plan of Care (Signed)
Problem: Hemodynamic Status: Cardiac  Goal: Stable vital signs and fluid balance  Outcome: Progressing   09/05/16 0502   Goal/Interventions addressed this shift   Stable vital signs and fluid balance Monitor/assess vital signs and telemetry per unit protocol;Assess signs and symptoms associated with cardiac rhythm changes;Monitor intake/output per unit protocol and/or LIP order;Monitor for leg swelling/edema and report to LIP if abnormal;Monitor lab values   Patient is very anxious and concerned about the headache he had after taking the Hydralazine. Refused the Famotidine after telling him the side effects. Explained to him and the sister who is equally concern  that the headache will resolve but our  main concerned is the high BP, considering his 3x h/o TIA. Also explained that his anxiety may cause his BP go up.   BP standing and lying is WNL at 0400 . NSR to ST on the monitor.

## 2016-09-05 NOTE — UM Notes (Signed)
09/05/2016    OOS BC/BS    09/02/16 1533  Place (admit) for Observation Services (Adult Observation Admit Panel (AX)) Once         INTERNAL MEDICINE PROGRESS NOTE    Date Time: 09/04/16 3:15 PM  Patient Name: Timothy Brandt, Timothy Brandt      Problem List:      Intractable Nausea and Vomiting   Dizziness   Viral Syndrome   Hypertensive urgency   Polycythemia Vera     Plan:      Start amlodipine in addition to atenolol and lisinopril to optimize blood pressure control   Tolerating clear liquids, will advance to full liquids and eventually regular diet   We will stop IV fluids   Will encourage patient to get up and walk around   GI prophylaxis   DVT prophylaxis   Discussed plan of care with patient    Discussed plan of care with nurses.   Disposition once his blood pressure is optimal    Subjective:     Patient was admitted on 09/02/2016 with intractable nausea and vomiting as well as dizziness.   EMR was reviewed and patient was seen with his nurse.  Patient is very concerned about his blood pressure.   I indicated that we need to add additional agent because his blood pressure is not optimal.  Even before  He came to the hospital his systolic blood pressure was running around 160-170.  He has been on atenolol  And lisinopril only.  His dose of lisinopril was increased and blood pressure is not optimal yet.  I recommended  To add amlodipine.  Side effects was explained to patient and patient would like to go ahead and start.     09/04/2016  BP 166/102  09/05/2016  BP 138/93    09/05/2016  Glucose 115, CO2 21, H/H 18.2/50.7, Hemolosys index 135-57,     09/05/2016  Norvasc PO daily, Tenormin PO daily, Lovenox SQ daily, Pepcid IV Q12, Hydrea PO daily, lisinopril PO 2xday, Tylenol PO Q6hrs PRN given x1 in last 24 hours, Apresoline IV Q4hrs PRN given x1 in last 24 hours, Desyrel PO at bedtime PRN given x1 in last 24 hours,

## 2016-09-05 NOTE — Plan of Care (Signed)
Problem: Physical Therapy  Goal: By discharge, patient will perform mobility at the patient's highest functional potential. See PT evaluation/note for goals.  Outcome: Progressing  Discharge Recommendation: Home with home health PT, Home with supervision (Pt will also benefit from OP neuro PT when appropriate. )  DME Recommended for Discharge: Other (Comment) (No DME needs, pt has RW/SPC. )    Is an Occupational Therapy Evaluation Indicated at this time? Yes, an OT evaluation is indicated at this time.    Treatment/Interventions: Continued evaluation, Compensatory technique education, Bed mobility, Equipment eval/education, Patient/family training, Endurance training, LE strengthening/ROM, Functional transfer training, Neuromuscular re-education, Stair training, Gait training, Exercise  PT Frequency: 3-4x/wk     PMP Activity: Step 7 - Walks out of Room  Distance Walked (ft) (Step 6,7): 60 Feet  (Please See Therapy Evaluation for device and assistance level needed)    Goals:   Goals  Goal Formulation: With patient  Time for Goal Acheivement: By time of discharge  Goals: Select goal  Pt Will Go Supine To Sit: modified independent, to maximize functional mobility and independence  Pt Will Perform Sit To Supine: modified independent, to maximize functional mobility and independence  Pt Will Perform Sit to Stand: modified independent, to maximize functional mobility and independence  Pt Will Demo Standing Balance: 4+/5 indep, moves/returns center of grav in multiple planes 1-2", to maximize functional mobility and independence, Not met  Pt Will Transfer Bed/Chair: with rolling walker, modified independent, to maximize functional mobility and independence (or using LRAD)  Pt Will Ambulate: 101-150 feet, with rolling walker, modified independent, to maximize functional mobility and independence (or using LRAD)  Pt Will Go Up / Down Stairs: 3-5 stairs, with supervision, With rail, to maximize functional mobility and  independence
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
## 2016-09-05 NOTE — Discharge Instr - AVS First Page (Signed)
Reason for your Hospital Admission:  ***      Instructions for after your discharge:  ***

## 2016-09-05 NOTE — Discharge Summary (Signed)
[facility]  DISCHARGE NOTE    Date Time: 09/05/16 3:43 PM  Patient Name: Timothy Brandt  Attending Physician: Saddie Benders, MD    Date of Admissions :      09/02/2016    Date of Discharge:     09/05/2016    Discharge Diagnosis:      Intractable Nausea and Vomiting   Dizziness   Viral Syndrome   Hypertensive urgency   Polycythemia Vera    Hyperlipidemia    Consultations:     Treatment Team:   Attending Provider: Saddie Benders, MD    Procedures Performed:     Ct Head Wo Contrast    Result Date: 09/02/2016   No acute intracranial process. Sandie Ano, MD 09/02/2016 12:24 PM    Chest Ap Portable    Result Date: 09/02/2016   Mild interstitial edema. Sandie Ano, MD 09/02/2016 12:02 PM      Hospital Course:     Patient is a 56 year old man with past medical history remarkable for hypertension and hyperlipidemia  Who presented  with intractable nausea and vomiting.  She was admitted for hypertensive urgency and  Intractable nausea and vomiting, possibly of viral origin.  Please refer to the H and P for details of his   Presentation.  He was admitted to the medical floor.  He was hydrated with intravenous fluids.  He was  Started on clear liquids and eventually advanced as his symptoms improved.  He was placed on anti-emetics   For nausea, vomiting.  His electrolytes and renal function were monitored closely.  His blood pressure was  Markedly elevated.  He was resumed on his home medication, which include lisinopril and atenolol.  His  Lisinopril dose was increased.  He was also getting hydralazine as needed.  Despite that, his blood pressure   was persistently elevated.  Amlodipine was added to his regimen.  His blood pressure improved markedly.   Currently, his blood pressure is optimal and is well controlled.  His nausea, vomiting as well as headache   has resolved.  He was seen by skilled services and was recommended to get outpatient physical therapy.   A referral was given for outpatient  physical therapy.  Patient is advised to follow with [place] primary care  Physician in one week.  I  have told him to discuss about treatment for his hyperlipidemia.  He has been  Intolerant to statins with difficulty walking while he was kept on statin in the past. Condition on discharge   Is stable.    Laboratory Results:     Recent Labs  Lab 09/05/16  0418 09/02/16  1117   Glucose 115* 127*   BUN 17.0 6.0*   Creatinine 1.2 0.9   Calcium 9.5 8.7   Sodium 137 137   Potassium 4.1 3.9   Chloride 104 104   CO2 21* 22   Albumin  --  4.0   AST (SGOT)  --  23   ALT  --  16   Bilirubin, Total  --  1.3*   Alkaline Phosphatase  --  76       Recent Labs  Lab 09/05/16  0418 09/02/16  1117   WBC 10.70 11.90*   Hgb 18.2* 16.8   Hematocrit 50.7 46.4   MCV 110.9* 110.0*   MCH 39.8* 39.8*   MCHC 35.9 36.2*   Platelets 325 319       Discharge Medications:     Current  Discharge Medication List      START taking these medications    Details   amLODIPine (NORVASC) 10 MG tablet Take 1 tablet (10 mg total) by mouth daily.  Qty: 30 tablet, Refills: 0         CONTINUE these medications which have CHANGED    Details   atenolol (TENORMIN) 50 MG tablet Take 1 tablet (50 mg total) by mouth daily.  Qty: 30 tablet, Refills: 0      lisinopril (PRINIVIL,ZESTRIL) 20 MG tablet Take 1 tablet (20 mg total) by mouth 2 (two) times daily.  Qty: 60 tablet, Refills: 0         CONTINUE these medications which have NOT CHANGED    Details   hydroxyurea (HYDREA) 500 MG capsule Take by mouth daily.             Physical Examination:     VITAL SIGNS   Temp:  [96.6 F (35.9 C)-98.4 F (36.9 C)] 97.8 F (36.6 C)  Heart Rate:  [69-93] 69  Resp Rate:  [18] 18  BP: (112-160)/(68-105) 149/103  No Data Recorded  SpO2: 94 %    Intake/Output Summary (Last 24 hours) at 09/05/16 1543  Last data filed at 09/05/16 1347   Gross per 24 hour   Intake              150 ml   Output              500 ml   Net             -350 ml          General appearance : alert, and in no  distress  HEENT: pinkish conjunctiva, anicteric sclera, mist mucus membrane.  Neck : supple, no adneopathy  Chest : clear to auscultation  Heart : S1 S2 heard, normal rate and regular rhythm  Abdomen : soft abdomen, non tender, active bowel sounds.  Extremities : no pedal edema  Neurological : Alert, follows command.      Discharge Instructions:     Disposition : Home  Activity : as tolerated  Diet : low salt  Follow Up :  Gunnar Fusi, MD  [address]  [place] [address]  [phone]    Follow up in 2 week(s)  Follow up of polycythemia vera    Margrett Rud, MD  [address]  [place] [address]  [phone]    Follow up in 5 day(s)        Signed by: Ledora Bottcher, M.D.  Jani Files: [phone]

## 2016-09-05 NOTE — Progress Notes (Signed)
Pt is d/c'ed home, d/c instructions and Rxs for Amlodipine, Atenolol, Lisinopril, & Hydroxyurea explained and given to pt who verbalized understanding as well, peripheral IV line d/c'ed, tele monitoring box returned, and pt left the floor on a wheelchair assisted by CT on duty in a stable condition.

## 2016-09-06 NOTE — Progress Notes (Signed)
Made several attempts to reach patient in order to set up home health. Was unable to reach him today.

## 2016-09-07 NOTE — Progress Notes (Signed)
Called and spoke with patient about [facility] services, explaining in detail. Patient decided that he would rather go to outpatient PT. Will defer for [facility].

## 2016-12-25 ENCOUNTER — Emergency Department
Admission: EM | Admit: 2016-12-25 | Discharge: 2016-12-25 | Disposition: A | Discharge status: Home / Self Care | Payer: Medicare Other | Attending: Emergency Medicine | Admitting: Emergency Medicine

## 2016-12-25 DIAGNOSIS — K644 Residual hemorrhoidal skin tags: Secondary | ICD-10-CM | POA: Insufficient documentation

## 2016-12-25 DIAGNOSIS — R718 Other abnormality of red blood cells: Secondary | ICD-10-CM

## 2016-12-25 DIAGNOSIS — Z79899 Other long term (current) drug therapy: Secondary | ICD-10-CM | POA: Insufficient documentation

## 2016-12-25 DIAGNOSIS — Z8673 Personal history of transient ischemic attack (TIA), and cerebral infarction without residual deficits: Secondary | ICD-10-CM | POA: Insufficient documentation

## 2016-12-25 DIAGNOSIS — D72821 Monocytosis (symptomatic): Secondary | ICD-10-CM

## 2016-12-25 DIAGNOSIS — I10 Essential (primary) hypertension: Secondary | ICD-10-CM | POA: Insufficient documentation

## 2016-12-25 LAB — CELL MORPHOLOGY
Cell Morphology: ABNORMAL — AB
Platelet Estimate: NORMAL

## 2016-12-25 LAB — MAN DIFF ONLY
Atypical Lymphocytes %: 10 %
Atypical Lymphocytes Absolute: 0.79 10*3/uL — ABNORMAL HIGH
Band Neutrophils Absolute: 0.16 10*3/uL (ref 0.00–1.00)
Band Neutrophils: 2 %
Basophils Absolute Manual: 0.08 10*3/uL (ref 0.00–0.20)
Basophils Manual: 1 %
Eosinophils Absolute Manual: 0 10*3/uL (ref 0.00–0.70)
Eosinophils Manual: 0 %
Lymphocytes Absolute Manual: 0.55 10*3/uL (ref 0.50–4.40)
Lymphocytes Manual: 7 %
Monocytes Absolute: 1.26 10*3/uL — ABNORMAL HIGH (ref 0.00–1.20)
Monocytes Manual: 16 %
Neutrophils Absolute Manual: 5.03 10*3/uL (ref 1.80–8.10)
Segmented Neutrophils: 64 %

## 2016-12-25 LAB — CBC AND DIFFERENTIAL
Absolute NRBC: 0 10*3/uL
Hematocrit: 47.2 % (ref 42.0–52.0)
Hgb: 16.7 g/dL (ref 13.0–17.0)
MCH: 39.3 pg — ABNORMAL HIGH (ref 28.0–32.0)
MCHC: 35.4 g/dL (ref 32.0–36.0)
MCV: 111.1 fL — ABNORMAL HIGH (ref 80.0–100.0)
MPV: 8.8 fL — ABNORMAL LOW (ref 9.4–12.3)
Nucleated RBC: 0 /100 WBC (ref 0.0–1.0)
Platelets: 322 10*3/uL (ref 140–400)
RBC: 4.25 10*6/uL — ABNORMAL LOW (ref 4.70–6.00)
RDW: 15 % (ref 12–15)
WBC: 7.86 10*3/uL (ref 3.50–10.80)

## 2016-12-25 LAB — PT AND APTT
PT INR: 0.9 (ref 0.9–1.1)
PT: 12.5 s — ABNORMAL LOW (ref 12.6–15.0)
PTT: 29 s (ref 23–37)

## 2016-12-25 LAB — COMPREHENSIVE METABOLIC PANEL
ALT: 18 U/L (ref 0–55)
AST (SGOT): 25 U/L (ref 5–34)
Albumin/Globulin Ratio: 1.1 (ref 0.9–2.2)
Albumin: 3.7 g/dL (ref 3.5–5.0)
Alkaline Phosphatase: 68 U/L (ref 38–106)
BUN: 13 mg/dL (ref 9.0–28.0)
Bilirubin, Total: 0.8 mg/dL (ref 0.2–1.2)
CO2: 24 mEq/L (ref 22–29)
Calcium: 9.1 mg/dL (ref 8.5–10.5)
Chloride: 105 mEq/L (ref 100–111)
Creatinine: 0.9 mg/dL (ref 0.7–1.3)
Globulin: 3.4 g/dL (ref 2.0–3.6)
Glucose: 95 mg/dL (ref 70–100)
Potassium: 4.1 mEq/L (ref 3.5–5.1)
Protein, Total: 7.1 g/dL (ref 6.0–8.3)
Sodium: 138 mEq/L (ref 136–145)

## 2016-12-25 LAB — GFR: EGFR: >60

## 2016-12-25 MED ORDER — DOCUSATE SODIUM 100 MG PO CAPS
100.0000 mg | ORAL_CAPSULE | Freq: Two times a day (BID) | ORAL | 0 refills | Status: AC
Start: 2016-12-25 — End: 2017-01-01

## 2016-12-25 NOTE — Discharge Instructions (Signed)
Dear Mr. Neomia Dear:    Thank you for choosing the [facility] Emergency Department, the premier emergency department in the [place] area.  I hope your visit today was EXCELLENT.    Specific instructions for your visit today:      Hemorrhoids    You have been diagnosed with hemorrhoids.    Hemorrhoids are swollen veins around the anus (rectum). Veins are blood vessels that carry blood to the heart. Straining to move the bowels causes most hemorrhoids. Hemorrhoids often happen during pregnancy. They may also be a complication of liver disease. Hemorrhoids can be external (outside the rectum) or internal (inside the rectum). External hemorrhoids may feel like a lump near the anus. Some hemorrhoids have blood clots inside; these are very painful. You may see red blood on your toilet paper, in the toilet bowl or on your stool (poop).    Hemorrhoids are often treated with stool softeners, which make bowel movements easier. Your doctor may recommend a cream or suppository (medicine put in the rectum) to help with swelling. Your doctor may also recommend pain medicine. Use all medicines as prescribed.    Take a hot "sitz bath" for at least 15 minutes, 3-4 times a day and after each bowel movement to help the pain. It will also help to keep the anus clean. Keeping the area clean is VERY IMPORTANT to help the hemorrhoid heal.   TO TAKE A SITZ BATH: Fill the bath tub with a few inches of hot water. The water SHOULD NOT be so hot that it causes discomfort. Sit in the water. Briskly swish water against your anus to clean off any stool around the area. This will also soothe the irritated skin. Do this for about 15 minutes.    Increase the fiber or bulk in your diet. Choose foods high in fiber like fruits, vegetables and whole grain breads. Your doctor may also recommend a fiber supplement.   Don't sit on the toilet too long. No reading or relaxing!   Do not strain (push too hard)    Follow up as directed. You may be  referred to a surgeon for further evaluation of your hemorrhoids.    YOU SHOULD SEEK MEDICAL ATTENTION IMMEDIATELY, EITHER HERE OR AT THE NEAREST EMERGENCY DEPARTMENT, IF ANY OF THE FOLLOWING OCCURS:   The pain suddenly gets worse.   Repeated vomiting (throwing up), or vomiting blood or material that looks like coffee grounds.   Your stool has blood, gets very dark or looks like tar.                 If you do not continue to improve or your condition worsens, please contact your doctor or return immediately to the Emergency Department.    Sincerely,  Lang, Alcario Drought, MD  Attending Emergency Physician  [facility] Emergency Department    ONSITE PHARMACY  Our full service onsite pharmacy is located in the ER waiting room.  Open 7 days a week from 9 am to 11 pm.  We accept all major insurances and prices are competitive with major retailers.  Ask your provider to print your prescriptions down to the pharmacy to speed you on your way home.    OBTAINING A PRIMARY CARE APPOINTMENT    Primary care physicians (PCPs, also known as primary care doctors) are either internists or family medicine doctors. Both types of PCPs focus on health promotion, disease prevention, patient education and counseling, and treatment of acute and chronic medical  conditions.    Call for an appointment with a primary care doctor.  Ask to see who is taking new patients.     [facility]  telephone:  [phone]  [email or web address]    DOCTOR REFERRALS  Call [phone] (available 24 hours a day, 7 days a week) if you need any further referrals and we can help you find a primary care doctor or specialist.  Also, available online at:  [email or web address]    YOUR CONTACT INFORMATION  Before leaving please check with registration to make sure we have an up-to-date contact number.  You can call registration at [phone] to update your information.  For questions about your hospital bill, please call  [phone].  For questions about your Emergency Dept Physician bill please call [phone].      FREE HEALTH SERVICES  If you need help with health or social services, please call 2-1-1 for a free referral to resources in your area.  2-1-1 is a free service connecting people with information on health insurance, free clinics, pregnancy, mental health, dental care, food assistance, housing, and substance abuse counseling.  Also, available online at:  http://www.[facility].org    MEDICAL RECORDS AND TESTS  Certain laboratory test results do not come back the same day, for example urine cultures.   We will contact you if other important findings are noted.  Radiology films are often reviewed again to ensure accuracy.  If there is any discrepancy, we will notify you.      Please call [phone] to pick up a complimentary CD of any radiology studies performed.  If you or your doctor would like to request a copy of your medical records, please call [phone].      ORTHOPEDIC INJURY   Please know that significant injuries can exist even when an initial x-ray is read as normal or negative.  This can occur because some fractures (broken bones) are not initially visible on x-rays.  For this reason, close outpatient follow-up with your primary care doctor or bone specialist (orthopedist) is required.    MEDICATIONS AND FOLLOWUP  Please be aware that some prescription medications can cause drowsiness.  Use caution when driving or operating machinery.    The examination and treatment you have received in our Emergency Department is provided on an emergency basis, and is not intended to be a substitute for your primary care physician.  It is important that your doctor checks you again and that you report any new or remaining problems at that time.      24 HOUR PHARMACIES  The nearest 24 hour pharmacy is:    [facility]  [address]  [place], [place] [address]  [phone]      ASSISTANCE WITH  INSURANCE    Affordable Care Act  [facility])  Call to start or finish an application, compare plans, enroll or ask a question.  [phone]  TTY: [phone]  Web:  Healthcare.gov    Help Enrolling in [facility]  [employer] [place]  978-451-3878 (TOLL-FREE)  669-622-8300 (TTY)  Web:  Http://www.coverva.org    Local Help Enrolling in the [facility]  [place] [facility]  760-314-8466 (MAIN)  Email:  health-help@nvfs .org  Web:  BlackjackMyths.is  Address:  [address] [place], [place] [address]    SEDATING MEDICATIONS  Sedating medications include strong pain medications (e.g. narcotics), muscle relaxers, benzodiazepines (used for anxiety and as muscle relaxers), Benadryl/diphenhydramine and other antihistamines for allergic reactions/itching, and other medications.  If you are unsure if you have received a sedating medication, please ask your physician or nurse.  If you received a sedating medication: DO NOT drive a car. DO NOT operate machinery. DO NOT perform jobs where you need to be alert.  DO NOT drink alcoholic beverages while taking this medicine.     If you get dizzy, sit or lie down at the first signs. Be careful going up and down stairs.  Be extra careful to prevent falls.     Never give this medicine to others.     Keep this medicine out of reach of children.     Do not take or save old medicines. Throw them away when outdated.     Keep all medicines in a cool, dry place. DO NOT keep them in your bathroom medicine cabinet or in a cabinet above the stove.    MEDICATION REFILLS  Please be aware that we cannot refill any prescriptions through the ER. If you need further treatment from what is provided at your ER visit, please follow up with your primary care doctor or your pain management specialist.    FREESTANDING EMERGENCY DEPARTMENTS OF [facility]  Did you know [facility] has two freestanding ERs located just a few miles away?  [facility] [place] and [facility]  [place] have short wait times, easy free parking directly in front of the building and top patient satisfaction scores - and the same Board Certified Emergency Medicine doctors as [facility].

## 2016-12-25 NOTE — ED Provider Notes (Signed)
[facility] EMERGENCY DEPARTMENT H&P                                             ATTENDING SUPERVISORY NOTE         ATTENDING NOTE      The patient was seen and examined by the Midlevel/Resident. I have reviewed and agree with the history except as noted. I agree with the plan as presented to me. I have reviewed and agree with the final ED diagnosis and disposition.    I spoke to and examined the patient as well.  I was present during key portions of any procedures performed.     HPI: Timothy Brandt is a 56 y.o. male with PMHx of HTN, TIA, who p/w 2 days of intermittent moderate rectal bleeding with bright red blood. No abdominal pain, N/V/Brandt.     PEX:     Constitutional: Vital signs reviewed. Well appearing. Cooperative.   Head: Normocephalic, atraumatic   Eyes: No conjunctival injection or pallor. No discharge.   Neurological: No focal motor deficits by observation. Speech normal. Memory normal.   Skin: Warm and dry. No rash. No pallor.  Psychiatric: Normal affect. Normal concentration.   Rectal: small bleeding of external and internal hemorrhoids, brown stool noted     Pt with hemorrhoid - stable labs - okay to [place] home.      O2 sat-           saturation: 96 %; Oxygen use: room air; Interpretation: Normal    Monitor -         interpreted by me: normal sinus 80's         Lendell Caprice, MD    I was acting as a scribe for Lendell Caprice, MD on Timothy Brandt,Timothy Brandt  Treatment Team: Scribe: Timothy Brandt     I am the first provider for this patient and I personally performed the services documented. Treatment Team: Scribe: Timothy Brandt is scribing for me on Timothy Brandt,Timothy Brandt. This note and the patient instructions accurately reflect work and decisions made by me.  Lendell Caprice, MD                  Lendell Caprice, MD  12/25/16 Timothy Brandt

## 2016-12-25 NOTE — ED Notes (Signed)
Bed: N K  Expected date:   Expected time:   Means of arrival:   Comments:

## 2016-12-25 NOTE — ED Notes (Signed)
This patient was seen by me in triage and initial testing was ordered based on presenting complaint. Care was expedited. I am not the primary provider for this patient.        Joice Lofts, MD  12/25/16 [phone]

## 2016-12-25 NOTE — ED Provider Notes (Signed)
[facility] EMERGENCY DEPARTMENT APP H&P         CLINICAL SUMMARY          Diagnosis:    .     Final diagnoses:   External hemorrhoid, bleeding         MDM Notes:       56 y/o M w/ intermittent rectal bleeding Brandt/t bleeding internal and external hemorrhoids. Brown stool in rectal vault. Pt is well appearing w/ stable vitals. No abd tenderness. No other complaints. Ok for Brandt/c home, f/u PCP or GI outpatient. Return to ED for worsening sx or complaints.       Disposition:         Discharge         Discharge Prescriptions     Medication Sig Dispense Auth. Provider    docusate sodium (COLACE) 100 MG capsule Take 1 capsule (100 mg total) by mouth 2 (two) times daily.for 7 days 14 capsule Honor Junes, PA                  CLINICAL INFORMATION        HPI:      Chief Complaint: Rectal Bleeding  .      Context: home  Location: n/a  Duration: 3 days  Quality: NA  Timing: intermittent  Maximum Severity: moderate  Modifying Factors: none    Timothy Brandt is a 56 y.o. male  has a past medical history of Hypertension; Polycythemia vera; and TIA (transient ischemic attack). who presents with rectal bleeding for x 3 days.    History of HTN, TIA, polycythemia vera.   He reports that he has had x 3 bowel movements in the last x 3 days in which he has noticed BRB mixed in.     Denies rectal pain, abdominal pain, nausea, vomiting, diarrhea, history of GI bleed or hemorrhoids.     History obtained from: Patient          ROS:      Positive and negative ROS elements as per HPI.  All other systems reviewed and negative.      Physical Exam:      Pulse 87  BP (!) 137/93  Resp 18  SpO2 96 %  Temp 97.9 F (36.6 C)    Physical Exam   Constitutional: He is oriented to person, place, and time. He appears well-developed and well-nourished. No distress.   HENT:   Head: Normocephalic and atraumatic.   Eyes: Pupils are equal, round, and reactive to light. Conjunctivae and EOM are normal.   Cardiovascular: Normal rate, regular  rhythm, normal heart sounds and intact distal pulses.    Pulmonary/Chest: Effort normal and breath sounds normal. No respiratory distress.   Abdominal: Soft. Bowel sounds are normal. There is no tenderness.   Genitourinary: Rectal exam shows external hemorrhoid (bleeding, non thrombosed, non tender) and internal hemorrhoid. Rectal exam shows guaiac negative stool.   Lymphadenopathy:     He has no cervical adenopathy.   Neurological: He is alert and oriented to person, place, and time.   Skin: Skin is warm and dry. He is not diaphoretic.   Nursing note and vitals reviewed.              PAST HISTORY        Primary Care Provider: Margrett Rud, MD        PMH/PSH:    .     Past Medical History:   Diagnosis Date   .  Hypertension    . Polycythemia vera    . TIA (transient ischemic attack)        He has a past surgical history that includes Splenectomy, total; Coccyx fracture surgery; and Femur Surgery.      Social/Family History:      He reports that he has never smoked. He has never used smokeless tobacco. He reports that he does not drink alcohol or use drugs.    History reviewed. No pertinent family history.      Listed Medications on Arrival:    .     Discharge Medication List as of 12/25/2016  5:14 PM      CONTINUE these medications which have NOT CHANGED    Details   atenolol (TENORMIN) 50 MG tablet Take 1 tablet (50 mg total) by mouth daily., Starting Sun 09/05/2016, Print      atorvastatin (LIPITOR) 20 MG tablet Take 20 mg by mouth daily., Historical Med      hydroxyurea (HYDREA) 500 MG capsule Take 1 capsule (500 mg total) by mouth daily., Starting Sun 09/05/2016, Print      lisinopril (PRINIVIL,ZESTRIL) 20 MG tablet Take 1 tablet (20 mg total) by mouth 2 (two) times daily., Starting Sun 09/05/2016, Print      minoxidil (LONITEN) 10 MG tablet Take 10 mg by mouth daily., Historical Med      amLODIPine (NORVASC) 10 MG tablet Take 1 tablet (10 mg total) by mouth daily., Starting Mon 09/06/2016, Print             Allergies: He has No Known Allergies.            VISIT INFORMATION        Clinical Course in the ED:      Counseling: I have spoke with the patient/family and discussed today's findings, in addition to providing specific details for the plan of care. Questions are answered and there is agreement with the plan. Return precautions discussed.      Medications Given in the ED:    .     ED Medication Orders     None            Procedures:      Procedures      Interpretations:      Differential Diagnosis (not completely inclusive): High risk differentials considered, and include Hemorrhoids, Anal fissure, hemorrhoids, diverticular bleeding/hemorrhage, AVN, colitis, bowel perforation, gastric/duodenal ulcer, colon/small bowel ca    EKG:           Rhythm Strip Interpretation / Cardiac Monitor Analysis:           Pulse Ox Analysis: saturation: 96 %; Oxygen use: room air; Interpretation: Normal                RESULTS        Lab Results:      Results     Procedure Component Value Units Date/Time    Comprehensive metabolic panel [[id number]] Collected:  12/25/16 1639    Specimen:  Blood Updated:  12/25/16 1703     Glucose 95 mg/dL      BUN 40.9 mg/dL      Creatinine 0.9 mg/dL      Sodium 811 mEq/L      Potassium 4.1 mEq/L      Chloride 105 mEq/L      CO2 24 mEq/L      Calcium 9.1 mg/dL      Protein, Total 7.1 g/dL      Albumin 3.7  g/dL      AST (SGOT) 25 U/L      ALT 18 U/L      Alkaline Phosphatase 68 U/L      Bilirubin, Total 0.8 mg/dL      Globulin 3.4 g/dL      Albumin/Globulin Ratio 1.1    GFR [[id number]] Collected:  12/25/16 1639     Updated:  12/25/16 1703     EGFR >60.0              Radiology Results:      No orders to display               Scribe Attestation:      I was acting as a Neurosurgeon for Reina Fuse, PA on [place],Timothy Brandt  Karoline Caldwell    I am the first provider for this patient and I personally performed the services documented. Karoline Caldwell is scribing for me on [place],Timothy Brandt. This note and the patient  instructions accurately reflect work and decisions made by me.  Reina Fuse, PA            Reina Fuse [place], [place]  12/25/16 [phone]

## 2016-12-28 LAB — CBC PATHOLOGIST REVIEW

## 2017-05-30 ENCOUNTER — Other Ambulatory Visit: Payer: Self-pay

## 2017-05-30 ENCOUNTER — Inpatient Hospital Stay: Payer: Medicare Other

## 2017-05-30 ENCOUNTER — Inpatient Hospital Stay: Payer: Medicare Other | Attending: Oncology | Admitting: Oncology

## 2017-05-30 ENCOUNTER — Encounter: Payer: Self-pay | Admitting: Oncology

## 2017-05-30 VITALS — BP 130/87 | HR 66 | Temp 98.3°F | Resp 16 | Wt 211.5 lb

## 2017-05-30 DIAGNOSIS — Z7982 Long term (current) use of aspirin: Secondary | ICD-10-CM | POA: Diagnosis not present

## 2017-05-30 DIAGNOSIS — R531 Weakness: Secondary | ICD-10-CM | POA: Diagnosis not present

## 2017-05-30 DIAGNOSIS — I10 Essential (primary) hypertension: Secondary | ICD-10-CM | POA: Insufficient documentation

## 2017-05-30 DIAGNOSIS — D473 Essential (hemorrhagic) thrombocythemia: Secondary | ICD-10-CM | POA: Diagnosis not present

## 2017-05-30 DIAGNOSIS — Z8673 Personal history of transient ischemic attack (TIA), and cerebral infarction without residual deficits: Secondary | ICD-10-CM | POA: Insufficient documentation

## 2017-05-30 DIAGNOSIS — Z79899 Other long term (current) drug therapy: Secondary | ICD-10-CM | POA: Diagnosis not present

## 2017-05-30 DIAGNOSIS — G4700 Insomnia, unspecified: Secondary | ICD-10-CM | POA: Insufficient documentation

## 2017-05-30 DIAGNOSIS — E291 Testicular hypofunction: Secondary | ICD-10-CM | POA: Insufficient documentation

## 2017-05-30 DIAGNOSIS — E785 Hyperlipidemia, unspecified: Secondary | ICD-10-CM | POA: Insufficient documentation

## 2017-05-30 DIAGNOSIS — D45 Polycythemia vera: Secondary | ICD-10-CM | POA: Diagnosis not present

## 2017-05-30 DIAGNOSIS — D75839 Thrombocytosis, unspecified: Secondary | ICD-10-CM

## 2017-05-30 DIAGNOSIS — N529 Male erectile dysfunction, unspecified: Secondary | ICD-10-CM | POA: Insufficient documentation

## 2017-05-30 LAB — IRON AND TIBC
IRON: 23 ug/dL — AB (ref 45–182)
SATURATION RATIOS: 8 % — AB (ref 17.9–39.5)
TIBC: 300 ug/dL (ref 250–450)
UIBC: 277 ug/dL

## 2017-05-30 LAB — CBC WITH DIFFERENTIAL/PLATELET
BASOS ABS: 0 10*3/uL (ref 0–0.1)
BASOS PCT: 0 %
EOS ABS: 0.1 10*3/uL (ref 0–0.7)
EOS PCT: 1 %
HCT: 51.5 % (ref 40.0–52.0)
Hemoglobin: 17.6 g/dL (ref 13.0–18.0)
Lymphocytes Relative: 11 %
Lymphs Abs: 1 10*3/uL (ref 1.0–3.6)
MCH: 35.2 pg — ABNORMAL HIGH (ref 26.0–34.0)
MCHC: 34.1 g/dL (ref 32.0–36.0)
MCV: 103.1 fL — AB (ref 80.0–100.0)
MONO ABS: 1.4 10*3/uL — AB (ref 0.2–1.0)
Monocytes Relative: 15 %
Neutro Abs: 6.6 10*3/uL — ABNORMAL HIGH (ref 1.4–6.5)
Neutrophils Relative %: 73 %
Platelets: 583 10*3/uL — ABNORMAL HIGH (ref 150–440)
RBC: 4.99 MIL/uL (ref 4.40–5.90)
RDW: 15.2 % — ABNORMAL HIGH (ref 11.5–14.5)
WBC: 9.2 10*3/uL (ref 3.8–10.6)

## 2017-05-30 LAB — TSH: TSH: 1.919 u[IU]/mL (ref 0.350–4.500)

## 2017-05-30 LAB — FERRITIN: Ferritin: 19 ng/mL — ABNORMAL LOW (ref 24–336)

## 2017-05-30 NOTE — Progress Notes (Signed)
Patient here today as a new patient  
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
## 2017-05-31 LAB — ERYTHROPOIETIN: ERYTHROPOIETIN: 7.9 m[IU]/mL (ref 2.6–18.5)

## 2017-06-07 ENCOUNTER — Other Ambulatory Visit: Payer: Self-pay | Admitting: Oncology

## 2017-06-07 DIAGNOSIS — D45 Polycythemia vera: Secondary | ICD-10-CM

## 2017-06-09 ENCOUNTER — Inpatient Hospital Stay (HOSPITAL_BASED_OUTPATIENT_CLINIC_OR_DEPARTMENT_OTHER): Payer: Medicare Other | Admitting: Oncology

## 2017-06-09 ENCOUNTER — Encounter: Payer: Self-pay | Admitting: Oncology

## 2017-06-09 ENCOUNTER — Other Ambulatory Visit: Payer: Self-pay

## 2017-06-09 ENCOUNTER — Inpatient Hospital Stay: Payer: Medicare Other

## 2017-06-09 VITALS — BP 148/94 | HR 77 | Temp 98.9°F | Wt 213.5 lb

## 2017-06-09 VITALS — BP 115/77 | HR 76

## 2017-06-09 DIAGNOSIS — R531 Weakness: Secondary | ICD-10-CM

## 2017-06-09 DIAGNOSIS — E291 Testicular hypofunction: Secondary | ICD-10-CM

## 2017-06-09 DIAGNOSIS — D473 Essential (hemorrhagic) thrombocythemia: Secondary | ICD-10-CM

## 2017-06-09 DIAGNOSIS — R21 Rash and other nonspecific skin eruption: Secondary | ICD-10-CM

## 2017-06-09 DIAGNOSIS — D45 Polycythemia vera: Secondary | ICD-10-CM | POA: Diagnosis not present

## 2017-06-09 DIAGNOSIS — Z8673 Personal history of transient ischemic attack (TIA), and cerebral infarction without residual deficits: Secondary | ICD-10-CM | POA: Diagnosis not present

## 2017-06-09 DIAGNOSIS — E785 Hyperlipidemia, unspecified: Secondary | ICD-10-CM

## 2017-06-09 DIAGNOSIS — Z79899 Other long term (current) drug therapy: Secondary | ICD-10-CM

## 2017-06-09 DIAGNOSIS — I10 Essential (primary) hypertension: Secondary | ICD-10-CM | POA: Diagnosis not present

## 2017-06-09 DIAGNOSIS — G4700 Insomnia, unspecified: Secondary | ICD-10-CM | POA: Diagnosis not present

## 2017-06-09 DIAGNOSIS — N529 Male erectile dysfunction, unspecified: Secondary | ICD-10-CM

## 2017-06-09 DIAGNOSIS — Z7982 Long term (current) use of aspirin: Secondary | ICD-10-CM

## 2017-06-09 DIAGNOSIS — D75839 Thrombocytosis, unspecified: Secondary | ICD-10-CM

## 2017-06-09 DIAGNOSIS — D471 Chronic myeloproliferative disease: Secondary | ICD-10-CM | POA: Insufficient documentation

## 2017-06-09 DIAGNOSIS — Z9081 Acquired absence of spleen: Secondary | ICD-10-CM

## 2017-06-09 HISTORY — DX: Polycythemia vera: D45

## 2017-06-09 LAB — HEMOGLOBIN AND HEMATOCRIT, BLOOD
HCT: 50.4 % (ref 40.0–52.0)
HEMOGLOBIN: 16.7 g/dL (ref 13.0–18.0)

## 2017-06-09 NOTE — Progress Notes (Signed)
Patient here today for follow up.  Patient has been holding Hydroxyurea as instructed by Dr Tasia Catchings.  Patient states no new concerns today
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
## 2017-06-23 ENCOUNTER — Inpatient Hospital Stay: Payer: Medicare Other

## 2017-06-23 ENCOUNTER — Inpatient Hospital Stay: Payer: Medicare Other | Attending: Oncology

## 2017-06-23 DIAGNOSIS — D45 Polycythemia vera: Secondary | ICD-10-CM | POA: Diagnosis not present

## 2017-06-23 DIAGNOSIS — D473 Essential (hemorrhagic) thrombocythemia: Secondary | ICD-10-CM | POA: Diagnosis not present

## 2017-06-23 LAB — HEMATOCRIT: HCT: 51.3 % (ref 40.0–52.0)

## 2017-06-23 LAB — HEMOGLOBIN: Hemoglobin: 17.1 g/dL (ref 13.0–18.0)

## 2017-06-23 NOTE — Patient Instructions (Signed)

## 2017-06-24 ENCOUNTER — Other Ambulatory Visit: Payer: Self-pay | Admitting: *Deleted

## 2017-06-24 DIAGNOSIS — D45 Polycythemia vera: Secondary | ICD-10-CM

## 2017-06-30 ENCOUNTER — Ambulatory Visit: Payer: Medicare Other

## 2017-06-30 ENCOUNTER — Inpatient Hospital Stay: Payer: Medicare Other

## 2017-06-30 VITALS — BP 124/79 | HR 81 | Temp 97.0°F | Resp 18

## 2017-06-30 DIAGNOSIS — D45 Polycythemia vera: Secondary | ICD-10-CM

## 2017-06-30 LAB — HEMATOCRIT: HEMATOCRIT: 47 % (ref 40.0–52.0)

## 2017-06-30 LAB — HEMOGLOBIN: HEMOGLOBIN: 15.7 g/dL (ref 13.0–18.0)

## 2017-07-05 ENCOUNTER — Encounter: Payer: Self-pay | Admitting: Urology

## 2017-07-05 ENCOUNTER — Ambulatory Visit (INDEPENDENT_AMBULATORY_CARE_PROVIDER_SITE_OTHER): Payer: Medicare Other | Admitting: Urology

## 2017-07-05 VITALS — BP 126/75 | HR 97 | Ht 73.0 in | Wt 211.2 lb

## 2017-07-05 DIAGNOSIS — K4091 Unilateral inguinal hernia, without obstruction or gangrene, recurrent: Secondary | ICD-10-CM | POA: Diagnosis not present

## 2017-07-05 DIAGNOSIS — E291 Testicular hypofunction: Secondary | ICD-10-CM

## 2017-07-05 HISTORY — DX: Testicular hypofunction: E29.1
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
## 2017-07-07 ENCOUNTER — Ambulatory Visit (INDEPENDENT_AMBULATORY_CARE_PROVIDER_SITE_OTHER): Payer: Medicare Other | Admitting: Surgery

## 2017-07-07 ENCOUNTER — Inpatient Hospital Stay: Payer: Medicare Other

## 2017-07-07 ENCOUNTER — Telehealth: Payer: Self-pay

## 2017-07-07 ENCOUNTER — Encounter: Payer: Self-pay | Admitting: Surgery

## 2017-07-07 VITALS — BP 100/69 | HR 97 | Temp 98.3°F | Ht 73.0 in | Wt 211.0 lb

## 2017-07-07 DIAGNOSIS — K4090 Unilateral inguinal hernia, without obstruction or gangrene, not specified as recurrent: Secondary | ICD-10-CM | POA: Diagnosis not present

## 2017-07-07 DIAGNOSIS — D45 Polycythemia vera: Secondary | ICD-10-CM | POA: Diagnosis not present

## 2017-07-07 DIAGNOSIS — K429 Umbilical hernia without obstruction or gangrene: Secondary | ICD-10-CM | POA: Diagnosis not present

## 2017-07-07 DIAGNOSIS — R1033 Periumbilical pain: Secondary | ICD-10-CM | POA: Diagnosis not present

## 2017-07-07 LAB — HEMOGLOBIN: HEMOGLOBIN: 14.8 g/dL (ref 13.0–18.0)

## 2017-07-07 LAB — HEMATOCRIT: HCT: 44.6 % (ref 40.0–52.0)
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
## 2017-07-07 NOTE — Patient Instructions (Addendum)
We will a referral to Orthopedics so they could look at your leg. They will contact you with date and time.  We have scheduled you for a CT Scan of your Abdomen and Pelvis. This has been scheduled at [facility]. Please Check-in at 10:45 AM, 15 minutes prior to your scheduled appointment. If you need to reschedule your Scan, you may do so by calling [phone].  You will need to pick up a prep kit at least 24 hours in advance of your Scan: You may pick this up at the [place] department at [place] [facility], or [employer].  Bring a list of medications with you to your appointment and you may have nothing to eat or drink 4 hours prior to your CT Scan.     You have requested to have a Ventral Hernia Repair. This will be done on the week of July 8th by Dr. Olean Ree at [facility]. Please see your (BLUE) Pre-care sheet for more information.  You will need to arrange to be out of work for approximately 1-2 weeks and then you may return with a lifting restriction for 4 more weeks. If you have FMLA or Disability paperwork that needs to be filled out, please have your company fax your paperwork to [phone] or you may drop this by either office. This paperwork will be filled out within 3 days after your surgery has been completed.    Ventral Hernia A ventral hernia (also called an incisional hernia) is a hernia that occurs at the site of a previous surgical cut (incision) in the abdomen. The abdominal wall spans from your lower chest down to your pelvis. If the abdominal wall is weakened from a surgical incision, a hernia can occur. A hernia is a bulge of bowel or muscle tissue pushing out on the weakened part of the abdominal wall. Ventral hernias can get bigger from straining or lifting. Obese and older people are at higher risk for a ventral hernia. People who develop infections after surgery or require repeat incisions at the same site on the abdomen are  also at increased risk. CAUSES  A ventral hernia occurs because of weakness in the abdominal wall at an incision site.  SYMPTOMS  Common symptoms include:  A visible bulge or lump on the abdominal wall.  Pain or tenderness around the lump.  Increased discomfort if you cough or make a sudden movement. If the hernia has blocked part of the intestine, a serious complication can occur (incarcerated or strangulated hernia). This can become a problem that requires emergency surgery because the blood flow to the blocked intestine may be cut off. Symptoms may include:  Feeling sick to your stomach (nauseous).  Throwing up (vomiting).  Stomach swelling (distention) or bloating.  Fever.  Rapid heartbeat. DIAGNOSIS  Your health care provider will take a medical history and perform a physical exam. Various tests may be ordered, such as:  Blood tests.  Urine tests.  Ultrasonography.  X-rays.  Computed tomography (CT). TREATMENT  Watchful waiting may be all that is needed for a smaller hernia that does not cause symptoms. Your health care provider may recommend the use of a supportive belt (truss) that helps to keep the abdominal wall intact. For larger hernias or those that cause pain, surgery to repair the hernia is usually recommended. If a hernia becomes strangulated, emergency surgery needs to be done right away. HOME CARE INSTRUCTIONS  Avoid putting pressure or strain on the abdominal  area.  Avoid heavy lifting.  Use good body positioning for physical tasks. Ask your health care provider about proper body positioning.  Use a supportive belt as directed by your health care provider.  Maintain a healthy weight.  Eat foods that are high in fiber, such as whole grains, fruits, and vegetables. Fiber helps prevent difficult bowel movements (constipation).  Drink enough fluids to keep your urine clear or pale yellow.  Follow up with your health care provider as directed. SEEK  MEDICAL CARE IF:   Your hernia seems to be getting larger or more painful. SEEK IMMEDIATE MEDICAL CARE IF:   You have abdominal pain that is sudden and sharp.  Your pain becomes severe.  You have repeated vomiting.  You are sweating a lot.  You notice a rapid heartbeat.  You develop a fever. MAKE SURE YOU:   Understand these instructions.  Will watch your condition.  Will get help right away if you are not doing well or get worse.     Open Ventral Hernia Repair Open ventral hernia repairis a surgery to fix a ventral hernia. Aventral hernia,  is a bulge of body tissue or intestines that pushes through the front part of the abdomen. This can happen if the connective tissue covering the muscles over the abdomen has a weak spot or is torn because of a surgical cut (incision) from a previous surgery. A ventral hernia repair is often done soon after diagnosis to stop the hernia from getting bigger, becoming uncomfortable, or becoming an emergency. This surgery usually takes about 2 hours, but the time can vary greatly.  LET [facility] CARE PROVIDER KNOW ABOUT:  Any allergies you have.  All medicines you are taking, including steroids, vitamins, herbs, eye drops, creams, and over-the-counter medicines.  Previous problems you or members of your family have had with the use of anesthetics.  Any blood disorders you have.  Previous surgeries you have had.  Medical conditions you have.  RISKS AND COMPLICATIONS  Generally, Open ventral hernia repair is a safe procedure. However, as with any surgical procedure, problems can occur. Possible problems include:  Bleeding.  Trouble passing urine or having a bowel movement after the surgery.  Infection.  Pneumonia.  Blood clots.  Pain in the area of the hernia.  A bulge in the area of the hernia that may be caused by a collection of fluid.  Injury to intestines or other structures in the abdomen.  Return of the hernia  after surgery.  BEFORE THE PROCEDURE   You may need to have blood tests, urine tests, a chest X-ray, or an electrocardiogram done before the day of the surgery.  Ask your health care provider about changing or stopping your regular medicines. This is especially important if you are taking diabetes medicines or blood thinners.  You may need to wash with a special type of germ-killing soap.  Do not eat or drink anything after midnight the night before the procedure or as directed by your health care provider.  Make plans to have someone drive you home after the procedure.  PROCEDURE   Small monitors will be put on your body. They are used to check your heart, blood pressure, and oxygen level.  An IV access tube will be put into a vein in your hand or arm. Fluids and medicine will flow directly into your body through the IV tube.  You will be given medicine that makes you go to sleep (general anesthetic).  Your abdomen  will be cleaned with a special soap to kill any germs on your skin.  Once you are asleep, a moderate - large size incision will be made in your abdomen. The size of incision depends on how large your hernia is.  Your surgeon puts the tissue or intestines that formed the hernia back in place.  A screen-like patch (mesh) is used to close the hernia. This helps make the area stronger. Stitches, tacks, or staples are used to keep the mesh in place.  Medicine and a bandage (dressing) or skin glue will be put over the incision.  AFTER THE PROCEDURE   You will stay in a recovery area until the anesthetic wears off. Your blood pressure and pulse will be checked often.  You may be able to go home the same day or may need to stay in the hospital for 1-2 days after surgery. Your surgeon will decide when you can go home depending upon your recovery.  You may feel some pain. You will be given medicine for pain.  You will be urged to do breathing exercises that involve taking  deep breaths. This helps prevent a lung infection after a surgery.  You may have to wear compression stockings while you are in the hospital. These stockings help keep blood clots from forming in your legs.   This information is not intended to replace advice given to you by your health care provider. Make sure you discuss any questions you have with your health care provider.   Document Released: 12/22/2011 Document Revised: 01/09/2013 Document Reviewed: 12/22/2011 Elsevier Interactive Patient Education 2016 [place] have chose to have your hernia repaired. This will be done by Dr. Olean Ree on the week of July 8th, 2019 at [facility].  Please see your (blue) Pre-care information that you have been given today.  You will need to arrange to be out of work for 2 weeks and then return with a lifting restrictions for 4 more weeks. Please send any FMLA paperwork prior to surgery and we will fill this out and fax it back to your employer within 3 business days.  You may have a bruise in your groin and also swelling and brusing in your testicle area. You may use ice 4-5 times daily for 15-20 minutes each time. Make sure that you place a barrier between you and the ice pack. To decrease the swelling, you may roll up a bath towel and place it vertically in between your thighs with your testicles resting on the towel. You will want to keep this area elevated as much as possible for several days following surgery.    Inguinal Hernia, Adult Muscles help keep everything in the body in its proper place. But if a weak spot in the muscles develops, something can poke through. That is called a hernia. When this happens in the lower part of the belly (abdomen), it is called an inguinal hernia. (It takes its name from a part of the body in this region called the inguinal canal.) A weak spot in the wall of muscles lets some fat or part of the small intestine bulge through. An inguinal hernia can develop  at any age. Men get them more often than women. CAUSES  In adults, an inguinal hernia develops over time.  It can be triggered by:  Suddenly straining the muscles of the lower abdomen.  Lifting heavy objects.  Straining to have a bowel movement. Difficult bowel movements (constipation) can lead to this.  Constant coughing. This may be caused by smoking or lung disease.  Being overweight.  Being pregnant.  Working at a job that requires long periods of standing or heavy lifting.  Having had an inguinal hernia before. One type can be an emergency situation. It is called a strangulated inguinal hernia. It develops if part of the small intestine slips through the weak spot and cannot get back into the abdomen. The blood supply can be cut off. If that happens, part of the intestine may die. This situation requires emergency surgery. SYMPTOMS  Often, a small inguinal hernia has no symptoms. It is found when a healthcare provider does a physical exam. Larger hernias usually have symptoms.   In adults, symptoms may include:  A lump in the groin. This is easier to see when the person is standing. It might disappear when lying down.  In men, a lump in the scrotum.  Pain or burning in the groin. This occurs especially when lifting, straining or coughing.  A dull ache or feeling of pressure in the groin.  Signs of a strangulated hernia can include:  A bulge in the groin that becomes very painful and tender to the touch.  A bulge that turns red or purple.  Fever, nausea and vomiting.  Inability to have a bowel movement or to pass gas. DIAGNOSIS  To decide if you have an inguinal hernia, a healthcare provider will probably do a physical examination.  This will include asking questions about any symptoms you have noticed.  The healthcare provider might feel the groin area and ask you to cough. If an inguinal hernia is felt, the healthcare provider may try to slide it back into the  abdomen.  Usually no other tests are needed. TREATMENT  Treatments can vary. The size of the hernia makes a difference. Options include:  Watchful waiting. This is often suggested if the hernia is small and you have had no symptoms.  No medical procedure will be done unless symptoms develop.  You will need to watch closely for symptoms. If any occur, contact your healthcare provider right away.  Surgery. This is used if the hernia is larger or you have symptoms.  Open surgery. This is usually an outpatient procedure (you will not stay overnight in a hospital). An cut (incision) is made through the skin in the groin. The hernia is put back inside the abdomen. The weak area in the muscles is then repaired by herniorrhaphy or hernioplasty. Herniorrhaphy: in this type of surgery, the weak muscles are sewn back together. Hernioplasty: a patch or mesh is used to close the weak area in the abdominal wall.  Laparoscopy. In this procedure, a surgeon makes small incisions. A thin tube with a tiny video camera (called a laparoscope) is put into the abdomen. The surgeon repairs the hernia with mesh by looking with the video camera and using two long instruments. HOME CARE INSTRUCTIONS   After surgery to repair an inguinal hernia:  You will need to take pain medicine prescribed by your healthcare provider. Follow all directions carefully.  You will need to take care of the wound from the incision.  Your activity will be restricted for awhile. This will probably include no heavy lifting for several weeks. You also should not do anything too active for a few weeks. When you can return to work will depend on the type of job that you have.  During "watchful waiting" periods, you should:  Maintain a healthy weight.  Eat a  diet high in fiber (fruits, vegetables and whole grains).  Drink plenty of fluids to avoid constipation. This means drinking enough water and other liquids to keep your urine clear  or pale yellow.  Do not lift heavy objects.  Do not stand for long periods of time.  Quit smoking. This should keep you from developing a frequent cough. SEEK MEDICAL CARE IF:   A bulge develops in your groin area.  You feel pain, a burning sensation or pressure in the groin. This might be worse if you are lifting or straining.  You develop a fever of more than 100.5 F (38.1 C). SEEK IMMEDIATE MEDICAL CARE IF:   Pain in the groin increases suddenly.  A bulge in the groin gets bigger suddenly and does not go down.  For men, there is sudden pain in the scrotum. Or, the size of the scrotum increases.  A bulge in the groin area becomes red or purple and is painful to touch.  You have nausea or vomiting that does not go away.  You feel your heart beating much faster than normal.  You cannot have a bowel movement or pass gas.  You develop a fever of more than 102.0 F (38.9 C).   This information is not intended to replace advice given to you by your health care provider. Make sure you discuss any questions you have with your health care provider.   Document Released: 05/23/2008 Document Revised: 03/29/2011 Document Reviewed: 07/08/2014 Elsevier Interactive Patient Education [employer].

## 2017-07-07 NOTE — Telephone Encounter (Signed)
I will fax a medical clearance to his oncologist (Dr. Tasia Catchings) to make sure that he is okay to have surgery.

## 2017-07-07 NOTE — Progress Notes (Signed)
HCT 44.6 today. No phlebotomy needed today.

## 2017-07-11 NOTE — Telephone Encounter (Signed)
A staff message was sent to Dr. Hampton Abbot with Dr. Collie Siad message.

## 2017-07-11 NOTE — Telephone Encounter (Signed)
We have received patients oncology clearance for surgery. Patient has been granted for surgery , but states please offer postop dvt prophylaxis with heparin or lovenor during his inpatient stay.clearance has been placed in patients chart.

## 2017-07-12 ENCOUNTER — Telehealth: Payer: Self-pay | Admitting: Surgery

## 2017-07-12 NOTE — Telephone Encounter (Signed)
Pt advised of pre op date/time and sx date. Sx: 07/28/17 with Dr Jaynie Bream left inguinal hernia and open left umbilical hernia repair.  Pre op: 07/22/17 @ 10:45am--office interview.   Patient made aware to call [phone], between 1-3:00pm the day before surgery, to find out what time to arrive.

## 2017-07-13 ENCOUNTER — Ambulatory Visit
Admission: RE | Admit: 2017-07-13 | Discharge: 2017-07-13 | Disposition: A | Discharge status: Home / Self Care | Payer: Medicare Other | Source: Ambulatory Visit | Attending: Surgery | Admitting: Surgery

## 2017-07-13 DIAGNOSIS — K4090 Unilateral inguinal hernia, without obstruction or gangrene, not specified as recurrent: Secondary | ICD-10-CM | POA: Insufficient documentation

## 2017-07-13 DIAGNOSIS — Z9081 Acquired absence of spleen: Secondary | ICD-10-CM | POA: Diagnosis not present

## 2017-07-13 DIAGNOSIS — Z8781 Personal history of (healed) traumatic fracture: Secondary | ICD-10-CM | POA: Diagnosis not present

## 2017-07-13 DIAGNOSIS — K429 Umbilical hernia without obstruction or gangrene: Secondary | ICD-10-CM | POA: Diagnosis not present

## 2017-07-13 DIAGNOSIS — N200 Calculus of kidney: Secondary | ICD-10-CM | POA: Diagnosis not present

## 2017-07-13 DIAGNOSIS — R1033 Periumbilical pain: Secondary | ICD-10-CM | POA: Insufficient documentation

## 2017-07-13 DIAGNOSIS — N400 Enlarged prostate without lower urinary tract symptoms: Secondary | ICD-10-CM | POA: Insufficient documentation

## 2017-07-13 MED ORDER — IOPAMIDOL (ISOVUE-300) INJECTION 61%
100.0000 mL | Freq: Once | INTRAVENOUS | Status: AC | PRN
  Administered 2017-07-13: 100 mL via INTRAVENOUS

## 2017-07-18 ENCOUNTER — Telehealth: Payer: Self-pay

## 2017-07-18 NOTE — Telephone Encounter (Signed)
Called patient to let him know that according to his CT Scan, it showed that he had a periumbilical and left inguinal hernia. It did not show any other abnormalities. I told him that Dr. Hampton Abbot will move forward with surgery. Patient understood and had nor further questions.

## 2017-07-18 NOTE — Telephone Encounter (Signed)
-----   Message from Olean Ree, MD sent at 07/14/2017  7:19 PM EDT ----- Regarding: RE: CT Scan results Hi!  Yes, I saw that last night.  I agree, pretty much just fat in the hernias.  Let's continue the plan as is with both umbilical and left inguinal hernias as open procedures.  Thanks!  Jose  ----- Message ----- From: Wayna Chalet, CMA Sent: 07/14/2017   8:52 AM To: Olean Ree, MD Subject: CT Scan results                                CT Scan results: Periumbilical hernia containing fat. Left inguinal hernia containing fat.  Patient is scheduled to have his surgery on 07/28/2017 with you.

## 2017-07-22 ENCOUNTER — Inpatient Hospital Stay: Admission: RE | Admit: 2017-07-22 | Payer: Medicare Other | Source: Ambulatory Visit

## 2017-07-22 ENCOUNTER — Other Ambulatory Visit: Payer: Medicare Other

## 2017-07-25 ENCOUNTER — Encounter
Admission: RE | Admit: 2017-07-25 | Discharge: 2017-07-25 | Disposition: A | Discharge status: Home / Self Care | Payer: Medicare Other | Source: Ambulatory Visit | Attending: Surgery | Admitting: Surgery

## 2017-07-25 ENCOUNTER — Inpatient Hospital Stay: Payer: Medicare Other | Attending: Oncology

## 2017-07-25 ENCOUNTER — Inpatient Hospital Stay: Payer: Medicare Other

## 2017-07-25 ENCOUNTER — Other Ambulatory Visit: Payer: Self-pay

## 2017-07-25 DIAGNOSIS — I119 Hypertensive heart disease without heart failure: Secondary | ICD-10-CM

## 2017-07-25 DIAGNOSIS — F419 Anxiety disorder, unspecified: Secondary | ICD-10-CM | POA: Diagnosis not present

## 2017-07-25 DIAGNOSIS — K4091 Unilateral inguinal hernia, without obstruction or gangrene, recurrent: Secondary | ICD-10-CM | POA: Diagnosis not present

## 2017-07-25 DIAGNOSIS — Z7982 Long term (current) use of aspirin: Secondary | ICD-10-CM | POA: Diagnosis not present

## 2017-07-25 DIAGNOSIS — I498 Other specified cardiac arrhythmias: Secondary | ICD-10-CM | POA: Insufficient documentation

## 2017-07-25 DIAGNOSIS — D45 Polycythemia vera: Secondary | ICD-10-CM | POA: Diagnosis not present

## 2017-07-25 DIAGNOSIS — Z79899 Other long term (current) drug therapy: Secondary | ICD-10-CM | POA: Diagnosis not present

## 2017-07-25 DIAGNOSIS — D473 Essential (hemorrhagic) thrombocythemia: Secondary | ICD-10-CM | POA: Insufficient documentation

## 2017-07-25 DIAGNOSIS — E291 Testicular hypofunction: Secondary | ICD-10-CM | POA: Diagnosis not present

## 2017-07-25 DIAGNOSIS — I10 Essential (primary) hypertension: Secondary | ICD-10-CM

## 2017-07-25 DIAGNOSIS — Z01810 Encounter for preprocedural cardiovascular examination: Secondary | ICD-10-CM | POA: Insufficient documentation

## 2017-07-25 DIAGNOSIS — F329 Major depressive disorder, single episode, unspecified: Secondary | ICD-10-CM | POA: Diagnosis not present

## 2017-07-25 DIAGNOSIS — K429 Umbilical hernia without obstruction or gangrene: Secondary | ICD-10-CM | POA: Diagnosis not present

## 2017-07-25 DIAGNOSIS — E785 Hyperlipidemia, unspecified: Secondary | ICD-10-CM | POA: Diagnosis not present

## 2017-07-25 DIAGNOSIS — Z888 Allergy status to other drugs, medicaments and biological substances status: Secondary | ICD-10-CM | POA: Diagnosis not present

## 2017-07-25 LAB — HEMOGLOBIN: Hemoglobin: 14 g/dL (ref 13.0–18.0)

## 2017-07-25 LAB — HEMATOCRIT: HEMATOCRIT: 43.4 % (ref 40.0–52.0)

## 2017-07-25 NOTE — Patient Instructions (Signed)
Your procedure is scheduled on: Thurs 7/11 Report to Day Surgery. To find out your arrival time please call [phone] between 1PM - 3PM on Wed. 7/10.  Remember: Instructions that are not followed completely may result in serious medical risk,  up to and including death, or upon the discretion of your surgeon and anesthesiologist your  surgery may need to be rescheduled.     _X__ 1. Do not eat food after midnight the night before your procedure.                 No gum chewing or hard candies. You may drink clear liquids up to 2 hours                 before you are scheduled to arrive for your surgery- DO not drink clear                 liquids within 2 hours of the start of your surgery.                 Clear Liquids include:  water, apple juice without pulp, clear carbohydrate                 drink such as Clearfast of Gatorade, Black Coffee or Tea (Do not add                 anything to coffee or tea).  __X__2.  On the morning of surgery brush your teeth with toothpaste and water, you                may rinse your mouth with mouthwash if you wish.  Do not swallow any toothpaste of mouthwash.     _X__ 3.  No Alcohol for 24 hours before or after surgery.   _X__ 4.  Do Not Smoke or use e-cigarettes For 24 Hours Prior to Your Surgery.                 Do not use any chewable tobacco products for at least 6 hours prior to                 surgery.  ____  5.  Bring all medications with you on the day of surgery if instructed.   _x___  6.  Notify your doctor if there is any change in your medical condition      (cold, fever, infections).     Do not wear jewelry, make-up, hairpins, clips or nail polish. Do not wear lotions, powders, or perfumes. You may wear deodorant. Do not shave 48 hours prior to surgery. Men may shave face and neck. Do not bring valuables to the hospital.    [facility] is not responsible for any belongings or valuables.  Contacts, dentures  or bridgework may not be worn into surgery. Leave your suitcase in the car. After surgery it may be brought to your room. For patients admitted to the hospital, discharge time is determined by your treatment team.   Patients discharged the day of surgery will not be allowed to drive home.   Please read over the following fact sheets that you were given:     ____ Take these medicines the morning of surgery with A SIP OF WATER:    1.   2.   3.   4.  5.  6.  ____ Fleet Enema (as directed)   __x__ Use CHG Soap as directed  ____ Use inhalers on the day  of surgery  ____ Stop metformin 2 days prior to surgery    ____ Take 1/2 of usual insulin dose the night before surgery. No insulin the morning          of surgery.   __x__ Stop aspirin today  __x__ Stop Anti-inflammatories  Ibuprofen-Diphenhydramine HCl (ADVIL PM) 200-25 MG CAPS until after surgery   ____ Stop supplements until after surgery.    ____ Bring C-Pap to the hospital.   Colace one twice a day 1 part rubbing alcohol 2 parts water  In quart size zip lock until cold

## 2017-07-28 ENCOUNTER — Ambulatory Visit: Payer: Medicare Other | Admitting: Anesthesiology

## 2017-07-28 ENCOUNTER — Ambulatory Visit
Admission: RE | Admit: 2017-07-28 | Discharge: 2017-07-28 | Disposition: A | Discharge status: Home / Self Care | Payer: Medicare Other | Source: Ambulatory Visit | Attending: Surgery | Admitting: Surgery

## 2017-07-28 ENCOUNTER — Encounter: Payer: Self-pay | Admitting: Anesthesiology

## 2017-07-28 ENCOUNTER — Encounter
Admission: RE | Disposition: A | Discharge status: Home / Self Care | Payer: Self-pay | Source: Ambulatory Visit | Attending: Surgery

## 2017-07-28 DIAGNOSIS — K4091 Unilateral inguinal hernia, without obstruction or gangrene, recurrent: Secondary | ICD-10-CM | POA: Diagnosis not present

## 2017-07-28 DIAGNOSIS — I10 Essential (primary) hypertension: Secondary | ICD-10-CM | POA: Insufficient documentation

## 2017-07-28 DIAGNOSIS — E291 Testicular hypofunction: Secondary | ICD-10-CM | POA: Insufficient documentation

## 2017-07-28 DIAGNOSIS — K4090 Unilateral inguinal hernia, without obstruction or gangrene, not specified as recurrent: Secondary | ICD-10-CM | POA: Diagnosis not present

## 2017-07-28 DIAGNOSIS — F329 Major depressive disorder, single episode, unspecified: Secondary | ICD-10-CM | POA: Insufficient documentation

## 2017-07-28 DIAGNOSIS — Z79899 Other long term (current) drug therapy: Secondary | ICD-10-CM | POA: Insufficient documentation

## 2017-07-28 DIAGNOSIS — K429 Umbilical hernia without obstruction or gangrene: Secondary | ICD-10-CM | POA: Diagnosis not present

## 2017-07-28 DIAGNOSIS — D45 Polycythemia vera: Secondary | ICD-10-CM | POA: Insufficient documentation

## 2017-07-28 DIAGNOSIS — E785 Hyperlipidemia, unspecified: Secondary | ICD-10-CM | POA: Insufficient documentation

## 2017-07-28 DIAGNOSIS — Z7982 Long term (current) use of aspirin: Secondary | ICD-10-CM | POA: Insufficient documentation

## 2017-07-28 DIAGNOSIS — F419 Anxiety disorder, unspecified: Secondary | ICD-10-CM | POA: Insufficient documentation

## 2017-07-28 DIAGNOSIS — Z888 Allergy status to other drugs, medicaments and biological substances status: Secondary | ICD-10-CM | POA: Insufficient documentation

## 2017-07-28 HISTORY — PX: UMBILICAL HERNIA REPAIR: SHX196

## 2017-07-28 HISTORY — PX: INGUINAL HERNIA REPAIR: SHX194

## 2017-07-28 SURGERY — REPAIR, HERNIA, UMBILICAL, ADULT
Anesthesia: General | Site: Inguinal | Wound class: Clean

## 2017-07-28 MED ORDER — IBUPROFEN 600 MG PO TABS: 600.0000 mg | ORAL_TABLET | Freq: Three times a day (TID) | ORAL | 0 refills | Status: DC | PRN

## 2017-07-28 MED ORDER — DEXAMETHASONE SODIUM PHOSPHATE 10 MG/ML IJ SOLN
INTRAMUSCULAR | Status: AC
  Filled 2017-07-28: qty 1

## 2017-07-28 MED ORDER — BUPIVACAINE-EPINEPHRINE (PF) 0.5% -1:200000 IJ SOLN
INTRAMUSCULAR | Status: DC | PRN
  Administered 2017-07-28: 30 mL

## 2017-07-28 MED ORDER — FAMOTIDINE 20 MG PO TABS
20.0000 mg | ORAL_TABLET | Freq: Once | ORAL | Status: AC
  Administered 2017-07-28: 20 mg via ORAL

## 2017-07-28 MED ORDER — ACETAMINOPHEN 500 MG PO TABS
1000.0000 mg | ORAL_TABLET | ORAL | Status: AC
  Administered 2017-07-28: 1000 mg via ORAL

## 2017-07-28 MED ORDER — BUPIVACAINE LIPOSOME 1.3 % IJ SUSP
20.0000 mL | Freq: Once | INTRAMUSCULAR | Status: DC
  Filled 2017-07-28: qty 20

## 2017-07-28 MED ORDER — FENTANYL CITRATE (PF) 100 MCG/2ML IJ SOLN: 25.0000 ug | INTRAMUSCULAR | Status: DC | PRN

## 2017-07-28 MED ORDER — CHLORHEXIDINE GLUCONATE CLOTH 2 % EX PADS: 6.0000 | MEDICATED_PAD | Freq: Once | CUTANEOUS | Status: DC

## 2017-07-28 MED ORDER — CEFAZOLIN SODIUM-DEXTROSE 2-4 GM/100ML-% IV SOLN
2.0000 g | INTRAVENOUS | Status: AC
  Administered 2017-07-28: 2 g via INTRAVENOUS

## 2017-07-28 MED ORDER — ACETAMINOPHEN 500 MG PO TABS
ORAL_TABLET | ORAL | Status: AC
  Filled 2017-07-28: qty 2

## 2017-07-28 MED ORDER — LIDOCAINE HCL (CARDIAC) PF 100 MG/5ML IV SOSY
PREFILLED_SYRINGE | INTRAVENOUS | Status: DC | PRN
  Administered 2017-07-28: 100 mg via INTRAVENOUS

## 2017-07-28 MED ORDER — SUGAMMADEX SODIUM 200 MG/2ML IV SOLN
INTRAVENOUS | Status: DC | PRN
  Administered 2017-07-28: 191.4 mg via INTRAVENOUS

## 2017-07-28 MED ORDER — PHENYLEPHRINE HCL 10 MG/ML IJ SOLN
INTRAMUSCULAR | Status: DC | PRN
  Administered 2017-07-28 (×2): 100 ug via INTRAVENOUS
  Administered 2017-07-28: 50 ug via INTRAVENOUS
  Administered 2017-07-28 (×3): 100 ug via INTRAVENOUS
  Administered 2017-07-28: 200 ug via INTRAVENOUS

## 2017-07-28 MED ORDER — ONDANSETRON HCL 4 MG/2ML IJ SOLN
INTRAMUSCULAR | Status: AC
  Filled 2017-07-28: qty 2

## 2017-07-28 MED ORDER — PROPOFOL 10 MG/ML IV BOLUS
INTRAVENOUS | Status: AC
Start: 2017-07-28 — End: ?
  Filled 2017-07-28: qty 40

## 2017-07-28 MED ORDER — FENTANYL CITRATE (PF) 100 MCG/2ML IJ SOLN
INTRAMUSCULAR | Status: AC
  Filled 2017-07-28: qty 2

## 2017-07-28 MED ORDER — OXYCODONE HCL 5 MG PO TABS: 5.0000 mg | ORAL_TABLET | Freq: Four times a day (QID) | ORAL | 0 refills | Status: DC | PRN

## 2017-07-28 MED ORDER — MIDAZOLAM HCL 2 MG/2ML IJ SOLN
INTRAMUSCULAR | Status: DC | PRN
  Administered 2017-07-28: 2 mg via INTRAVENOUS

## 2017-07-28 MED ORDER — SUGAMMADEX SODIUM 200 MG/2ML IV SOLN
INTRAVENOUS | Status: AC
  Filled 2017-07-28: qty 2

## 2017-07-28 MED ORDER — BUPIVACAINE-EPINEPHRINE (PF) 0.5% -1:200000 IJ SOLN
INTRAMUSCULAR | Status: AC
  Filled 2017-07-28: qty 30

## 2017-07-28 MED ORDER — IBUPROFEN 600 MG PO TABS
600.0000 mg | ORAL_TABLET | Freq: Three times a day (TID) | ORAL | Status: DC | PRN
  Administered 2017-07-28: 600 mg via ORAL
  Filled 2017-07-28 (×2): qty 1

## 2017-07-28 MED ORDER — LIDOCAINE HCL (PF) 2 % IJ SOLN
INTRAMUSCULAR | Status: AC
  Filled 2017-07-28: qty 10

## 2017-07-28 MED ORDER — DEXAMETHASONE SODIUM PHOSPHATE 10 MG/ML IJ SOLN
INTRAMUSCULAR | Status: DC | PRN
  Administered 2017-07-28: 10 mg via INTRAVENOUS

## 2017-07-28 MED ORDER — BUPIVACAINE LIPOSOME 1.3 % IJ SUSP
INTRAMUSCULAR | Status: AC
  Filled 2017-07-28: qty 20

## 2017-07-28 MED ORDER — ONDANSETRON HCL 4 MG/2ML IJ SOLN: 4.0000 mg | Freq: Once | INTRAMUSCULAR | Status: DC | PRN

## 2017-07-28 MED ORDER — GABAPENTIN 300 MG PO CAPS
ORAL_CAPSULE | ORAL | Status: AC
  Administered 2017-07-28: 300 mg via ORAL
  Filled 2017-07-28: qty 1

## 2017-07-28 MED ORDER — ONDANSETRON HCL 4 MG/2ML IJ SOLN
INTRAMUSCULAR | Status: DC | PRN
  Administered 2017-07-28: 4 mg via INTRAVENOUS

## 2017-07-28 MED ORDER — GABAPENTIN 300 MG PO CAPS
300.0000 mg | ORAL_CAPSULE | ORAL | Status: AC
  Administered 2017-07-28: 300 mg via ORAL

## 2017-07-28 MED ORDER — ROCURONIUM BROMIDE 100 MG/10ML IV SOLN
INTRAVENOUS | Status: DC | PRN
  Administered 2017-07-28: 45 mg via INTRAVENOUS
  Administered 2017-07-28: 5 mg via INTRAVENOUS
  Administered 2017-07-28 (×2): 20 mg via INTRAVENOUS
  Administered 2017-07-28: 10 mg via INTRAVENOUS

## 2017-07-28 MED ORDER — MIDAZOLAM HCL 2 MG/2ML IJ SOLN
INTRAMUSCULAR | Status: AC
  Filled 2017-07-28: qty 2

## 2017-07-28 MED ORDER — SODIUM CHLORIDE 0.9 % IJ SOLN
INTRAMUSCULAR | Status: DC | PRN
  Administered 2017-07-28: 10 mL

## 2017-07-28 MED ORDER — BUPIVACAINE LIPOSOME 1.3 % IJ SUSP
INTRAMUSCULAR | Status: DC | PRN
  Administered 2017-07-28: 20 mL

## 2017-07-28 MED ORDER — SODIUM CHLORIDE 0.9 % IV SOLN
INTRAVENOUS | Status: DC | PRN
  Administered 2017-07-28: 50 ug/min via INTRAVENOUS

## 2017-07-28 MED ORDER — ROCURONIUM BROMIDE 50 MG/5ML IV SOLN
INTRAVENOUS | Status: AC
  Filled 2017-07-28: qty 1

## 2017-07-28 MED ORDER — FAMOTIDINE 20 MG PO TABS
ORAL_TABLET | ORAL | Status: AC
  Administered 2017-07-28: 20 mg via ORAL
  Filled 2017-07-28: qty 1

## 2017-07-28 MED ORDER — CEFAZOLIN SODIUM-DEXTROSE 2-4 GM/100ML-% IV SOLN
INTRAVENOUS | Status: AC
  Filled 2017-07-28: qty 100

## 2017-07-28 MED ORDER — SUCCINYLCHOLINE CHLORIDE 20 MG/ML IJ SOLN
INTRAMUSCULAR | Status: DC | PRN
  Administered 2017-07-28: 100 mg via INTRAVENOUS

## 2017-07-28 MED ORDER — FENTANYL CITRATE (PF) 100 MCG/2ML IJ SOLN
INTRAMUSCULAR | Status: DC | PRN
  Administered 2017-07-28 (×2): 50 ug via INTRAVENOUS
  Administered 2017-07-28: 100 ug via INTRAVENOUS

## 2017-07-28 MED ORDER — LACTATED RINGERS IV SOLN
INTRAVENOUS | Status: DC
  Administered 2017-07-28 (×2): via INTRAVENOUS

## 2017-07-28 MED ORDER — PROPOFOL 10 MG/ML IV BOLUS
INTRAVENOUS | Status: DC | PRN
  Administered 2017-07-28: 200 mg via INTRAVENOUS

## 2017-07-28 MED ORDER — IBUPROFEN 600 MG PO TABS
ORAL_TABLET | ORAL | Status: AC
  Filled 2017-07-28: qty 1

## 2017-07-28 MED ORDER — SODIUM CHLORIDE 0.9 % IJ SOLN
INTRAMUSCULAR | Status: AC
  Filled 2017-07-28: qty 10

## 2017-07-28 SURGICAL SUPPLY — 42 items
BLADE SURG 15 STRL LF DISP TIS (BLADE) ×2 IMPLANT
BLADE SURG 15 STRL SS (BLADE) ×2
CANISTER SUCT 1200ML W/VALVE (MISCELLANEOUS) ×4 IMPLANT
CHLORAPREP W/TINT 26ML (MISCELLANEOUS) ×4 IMPLANT
DERMABOND ADVANCED (GAUZE/BANDAGES/DRESSINGS) ×2
DERMABOND ADVANCED .7 DNX12 (GAUZE/BANDAGES/DRESSINGS) ×2 IMPLANT
DRAIN PENROSE 1/4X12 LTX (DRAIN) ×4 IMPLANT
DRAPE LAPAROTOMY 100X77 ABD (DRAPES) ×4 IMPLANT
DRAPE LAPAROTOMY 77X122 PED (DRAPES) IMPLANT
ELECT CAUTERY BLADE 6.4 (BLADE) ×4 IMPLANT
ELECT REM PT RETURN 9FT ADLT (ELECTROSURGICAL) ×4
ELECTRODE REM PT RTRN 9FT ADLT (ELECTROSURGICAL) ×2 IMPLANT
GLOVE SURG SYN 7.0 (GLOVE) ×12 IMPLANT
GLOVE SURG SYN 7.5  E (GLOVE) ×2
GLOVE SURG SYN 7.5 E (GLOVE) ×2 IMPLANT
GOWN STRL REUS W/ TWL LRG LVL3 (GOWN DISPOSABLE) ×4 IMPLANT
GOWN STRL REUS W/TWL LRG LVL3 (GOWN DISPOSABLE) ×4
LABEL OR SOLS (LABEL) ×4 IMPLANT
MESH MARLEX PLUG MEDIUM (Mesh General) ×4 IMPLANT
MESH VENTRALEX ST 2.5 CRC MED (Mesh General) ×4 IMPLANT
NDL HPO THNWL 1X22GA REG BVL (NEEDLE) ×4 IMPLANT
NEEDLE HYPO 22GX1.5 SAFETY (NEEDLE) ×4 IMPLANT
NEEDLE SAFETY 22GX1 (NEEDLE) ×4
NS IRRIG 500ML POUR BTL (IV SOLUTION) ×4 IMPLANT
PACK BASIN MINOR ARMC (MISCELLANEOUS) ×4 IMPLANT
SPONGE LAP 18X18 RF (DISPOSABLE) ×4 IMPLANT
SUT ETHIBOND 0 MO6 C/R (SUTURE) ×4 IMPLANT
SUT MNCRL 4-0 (SUTURE) ×2
SUT MNCRL 4-0 27XMFL (SUTURE) ×2
SUT MNCRL AB 4-0 PS2 18 (SUTURE) ×4 IMPLANT
SUT PROLENE 2 0 SH DA (SUTURE) ×20 IMPLANT
SUT SILK 2 0 (SUTURE) ×2
SUT SILK 2-0 18XBRD TIE 12 (SUTURE) ×2 IMPLANT
SUT VIC AB 0 CT1 36 (SUTURE) ×4 IMPLANT
SUT VIC AB 2-0 CT1 (SUTURE) ×4 IMPLANT
SUT VIC AB 3-0 SH 27 (SUTURE) ×2
SUT VIC AB 3-0 SH 27X BRD (SUTURE) ×2 IMPLANT
SUTURE MNCRL 4-0 27XMF (SUTURE) ×2 IMPLANT
SYR 10ML LL (SYRINGE) ×4 IMPLANT
SYR 20CC LL (SYRINGE) ×4 IMPLANT
SYR 30ML LL (SYRINGE) ×4 IMPLANT
SYR BULB IRRIG 60ML STRL (SYRINGE) ×4 IMPLANT

## 2017-07-28 NOTE — Anesthesia Procedure Notes (Signed)
Procedure Name: Intubation Performed by: Demetrius Charity, CRNA Pre-anesthesia Checklist: Patient identified, Patient being monitored, Timeout performed, Emergency Drugs available and Suction available Patient Re-evaluated:Patient Re-evaluated prior to induction Oxygen Delivery Method: Circle system utilized Preoxygenation: Pre-oxygenation with 100% oxygen Induction Type: IV induction Ventilation: Mask ventilation without difficulty Laryngoscope Size: Mac and 4 Grade View: Grade II Tube type: Oral Tube size: 7.0 mm Number of attempts: 1 Airway Equipment and Method: Stylet Placement Confirmation: ETT inserted through vocal cords under direct vision,  positive ETCO2 and breath sounds checked- equal and bilateral Secured at: 23 cm Tube secured with: Tape Dental Injury: Teeth and Oropharynx as per pre-operative assessment

## 2017-07-28 NOTE — Transfer of Care (Signed)
Immediate Anesthesia Transfer of Care Note  Patient: Kenneth [name]  Procedure(s) Performed: HERNIA REPAIR UMBILICAL ADULT WITH MESH (N/A ) HERNIA REPAIR INGUINAL ADULT WITH MESH (Left Inguinal)  Patient Location: PACU  Anesthesia Type:General  Level of Consciousness: awake, alert  and oriented  Airway & Oxygen Therapy: Patient Spontanous Breathing and Patient connected to face mask oxygen  Post-op Assessment: Report given to RN and Post -op Vital signs reviewed and stable  Post vital signs: Reviewed and stable  Last Vitals:  Vitals Value Taken Time  BP 132/81 07/28/2017 12:42 PM  Temp    Pulse 80 07/28/2017 12:43 PM  Resp 0 07/28/2017 12:43 PM  SpO2 97 % 07/28/2017 12:43 PM  Vitals shown include unvalidated device data.  Last Pain:  Vitals:   07/28/17 0901  TempSrc: Tympanic  PainSc: 0-No pain         Complications: No apparent anesthesia complications
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
## 2017-07-28 NOTE — Op Note (Signed)
Procedure Date:  07/28/2017  Pre-operative Diagnosis:   Recurrent left inguinal hernia and umbilical hernia  Post-operative Diagnosis:  Recurrent left inguinal hernia and umbilical hernia  Procedure:  Left Inguinal Hernia Repair and Umbilical hernia repair  Surgeon:  Melvyn Neth, MD  Anesthesia:  General endotracheal  Estimated Blood Loss:  15 ml  Specimens:  Left groin hernia sac and left groin old mesh  Complications:  None  Indications for Procedure:  This is a 57 y.o. male who presents with a recurrent left inguinal hernia and an umbilical hernia.  The options of surgery versus observation were reviewed with the patient and/or family. The risks of bleeding, abscess or infection, recurrence of symptoms, potential for an open procedure, injury to surrounding structures, and chronic pain were all discussed with the patient and was willing to proceed.  Description of Procedure: The patient was correctly identified in the preoperative area and brought into the operating room.  The patient was placed supine with VTE prophylaxis in place.  Appropriate time-outs were performed.  Anesthesia was induced and the patient was intubated.  Appropriate antibiotics were infused.  The left groin up to the periumbilical area were prepped and draped in a sterile fashion. An oblique incision was made between the pubic symphysis extending laterally toward the ASIS. Using electrocautery, the subcutaneous tissues were dissected, assuring adequate hemostasis, until reaching the external oblique aponeurosis.  There was scarring at the aponeurosis from his prior surgery. A 1 cm incision was made over the aponeurosis and extended laterally and medially toward the external inguinal ring avoiding injury to the ilioinguinal nerve.  Further dissection was required to free up the apnoneurosis.  The cord was also scarred against the recurrent hernia contents, which were fat.  There was mesh that was bunched up  against the pubic tubercle.  This was dissected off carefully, keeping in view the vas deferens. Once the old mesh was removed, this allowed [place] to encircle the cord with a penrose drain. The hernia sac was entered and there was epiploic colon fat bulging out.  This was carefully freed and pushed back into the abdominal cavity.  The sac was then resected.     A Bard mesh perfix plug was inserted into the opening of the internal ring and secured in place using 2-0 Prolene sutures.  A Bard mesh was then placed and sutured to the pubic tubercle, shelving edge, and conjoined tendon with interrupted 2-0 Prolene sutures.  The tails of the mesh were crossed behind the cord and sutured together creating a new internal ring.  The external oblique was then closed in running fashion with 0 Vicryl, creating a new external ring.  30 ml of Exparel solution was infiltrated onto the aponeurosis and subcutaneous tissues. The wound was irrigated and the incision was closed in two layers with interrupted 2-0 Vicryl and running 4-0 Monocryl.    Attention was then shifted to the umbilical hernia.  An infraumbilical incision was made and cautery was used to dissect down the stalk to the fascia.  The stalk was free off the fascia, revealing the umbilical hernia.  Omentum was protruding through.  This was freed up from the fascial edges and pushed back into the abdomen.  The fascial edges were cleaned and the hernia sac was excised.  A medium Ventralex ST hernia patch was placed and the tails were secured to the fascia using 2-0 Prolene.  Then the fascia was closed over the mesh using 0 Ethibond, incorporating the mesh  as well with each suture. 30 ml of Exparel solution was then infiltrated onto the fascia and subcutaneous tissue.  The cavity was irrigated and the umbilical stalk was reattached using 2-0 Vicryl suture.  The wound was then closed in layers using 2-0 Vicryl and 4-0 Monocryl.  The inguinal and umbilical wounds were cleaned  and sealed with DermaBond.  The patient was emerged from anesthesia and extubated and brought to the recovery room for further management.  Both testicles were descended on palpation.  The patient tolerated the procedure well and all counts were correct at the end of the case.   Melvyn Neth, MD

## 2017-07-28 NOTE — Anesthesia Postprocedure Evaluation (Signed)
Anesthesia Post Note  Patient: Kenneth [name]  Procedure(s) Performed: HERNIA REPAIR UMBILICAL ADULT WITH MESH (N/A ) HERNIA REPAIR INGUINAL ADULT WITH MESH (Left Inguinal)  Patient location during evaluation: PACU Anesthesia Type: General Level of consciousness: awake and alert and oriented Pain management: pain level controlled Vital Signs Assessment: post-procedure vital signs reviewed and stable Respiratory status: spontaneous breathing Cardiovascular status: blood pressure returned to baseline Anesthetic complications: no     Last Vitals:  Vitals:   07/28/17 1322 07/28/17 1414  BP: (!) 144/94 134/87  Pulse: 85 83  Resp: 18 18  Temp: 36.7 C 36.7 C  SpO2: 95% 95%    Last Pain:  Vitals:   07/28/17 1414  TempSrc: Temporal  PainSc: 3                  Deforrest Bogle

## 2017-07-28 NOTE — Discharge Instructions (Addendum)
Do not restart your Aspirin until 07/30/17    AMBULATORY SURGERY  DISCHARGE INSTRUCTIONS   1) The drugs that you were given will stay in your system until tomorrow so for the next 24 hours you should not:  A) Drive an automobile B) Make any legal decisions C) Drink any alcoholic beverage   2) You may resume regular meals tomorrow.  Today it is better to start with liquids and gradually work up to solid foods.  You may eat anything you prefer, but it is better to start with liquids, then soup and crackers, and gradually work up to solid foods.   3) Please notify your doctor immediately if you have any unusual bleeding, trouble breathing, redness and pain at the surgery site, drainage, fever, or pain not relieved by medication.    4) Additional Instructions:        Please contact your physician with any problems or Same Day Surgery at [phone], Monday through Friday 6 am to 4 pm, or [place] at [facility] number at [phone].

## 2017-07-28 NOTE — Anesthesia Post-op Follow-up Note (Signed)
Anesthesia QCDR form completed.        

## 2017-07-28 NOTE — Interval H&P Note (Signed)
History and Physical Interval Note:  07/28/2017 9:01 AM  Kenneth [name]  has presented today for surgery, with the diagnosis of umbilical hernia and left inguinal hernia  The various methods of treatment have been discussed with the patient and family. After consideration of risks, benefits and other options for treatment, the patient has consented to  Procedure(s): HERNIA REPAIR UMBILICAL ADULT (N/A) HERNIA REPAIR INGUINAL ADULT (Left) as a surgical intervention .  The patient's history has been reviewed, patient examined, no change in status, stable for surgery.  I have reviewed the patient's chart and labs.  Questions were answered to the patient's satisfaction.     Talisa Petrak

## 2017-07-29 ENCOUNTER — Encounter: Payer: Self-pay | Admitting: Surgery

## 2017-07-29 LAB — SURGICAL PATHOLOGY

## 2017-08-05 ENCOUNTER — Encounter: Payer: Self-pay | Admitting: Surgery

## 2017-08-05 ENCOUNTER — Ambulatory Visit (INDEPENDENT_AMBULATORY_CARE_PROVIDER_SITE_OTHER): Payer: Medicare Other | Admitting: Surgery

## 2017-08-05 VITALS — BP 156/91 | HR 91 | Temp 98.0°F | Resp 16 | Ht 70.0 in | Wt 207.0 lb

## 2017-08-05 DIAGNOSIS — Z09 Encounter for follow-up examination after completed treatment for conditions other than malignant neoplasm: Secondary | ICD-10-CM

## 2017-08-05 NOTE — Patient Instructions (Addendum)
Take your Ibuprofen three times a day for the next week and then may try to decrease. Use other pain medication if needed. Use ice/cold to the surgical areas as needed for swelling Follow up in one month on 09/06/17 at 2:45 pm with Dr Rosana Hoes. No heavy lifting more than 10-15 pounds for the next 3 weeks

## 2017-08-05 NOTE — Progress Notes (Signed)
08/05/2017  HPI: Patient is s/p left recurrent inguinal hernia repair and umbilical hernia repair on 07/28/17.  He presents today for follow up.  He reports that initially he had a lot of swelling at the scrotum and groin, and this has improved over the course of the week.  He has only been taking Ibuprofen twice daily and no narcotics, but not because of no pain, but because he was worried about addiction.  Overall, he is still having pain and discomfort at the incision sites, but this has been improving.  Vital signs: BP (!) 156/91   Pulse 91   Temp 98 F (36.7 C)   Resp 16   Ht 5\' 10"  (1.778 m)   Wt 207 lb (93.9 kg)   BMI 29.70 kg/m    Physical Exam: Constitutional: No acute distress Abdomen:  Soft, nondistended, with appropriate tenderness to palpation.  There is mild ecchymosis at the umbilical incision with a bit of swelling.  No evidence of recurrence.  On the left groin, there is swelling at the groin but not extending beyond the groin.  No hernia recurrence.  Incisions are otherwise clean, dry, intact.  Assessment/Plan: 57 yo male s/p left recurrent inguinal hernia repair and umbilical hernia repair.  --Discussed with the patient that the swelling is very likely to be post-operative, particularly at the groin where it was a re-do case.  This will continue to improve. --Encouraged patient to use ice to the groin twice a day as needed to help with pain and swelling. --He can take the Ibuprofen up to three times a day and he can also take oxycodone if needed.  Instructed the patient on how to take the medications and he seemed more reassured about them. --He will follow up in a month so that we can check the swelling and make sure everything has healed well.   Melvyn Neth, [facility]

## 2017-08-18 ENCOUNTER — Inpatient Hospital Stay: Payer: Medicare Other

## 2017-08-18 ENCOUNTER — Inpatient Hospital Stay: Payer: Medicare Other | Attending: Oncology

## 2017-08-18 DIAGNOSIS — D45 Polycythemia vera: Secondary | ICD-10-CM | POA: Diagnosis not present

## 2017-08-18 DIAGNOSIS — D473 Essential (hemorrhagic) thrombocythemia: Secondary | ICD-10-CM | POA: Diagnosis not present

## 2017-08-18 DIAGNOSIS — Z79899 Other long term (current) drug therapy: Secondary | ICD-10-CM | POA: Diagnosis not present

## 2017-08-18 DIAGNOSIS — G4700 Insomnia, unspecified: Secondary | ICD-10-CM | POA: Insufficient documentation

## 2017-08-18 DIAGNOSIS — Z7982 Long term (current) use of aspirin: Secondary | ICD-10-CM | POA: Insufficient documentation

## 2017-08-18 DIAGNOSIS — I10 Essential (primary) hypertension: Secondary | ICD-10-CM | POA: Diagnosis not present

## 2017-08-18 DIAGNOSIS — E785 Hyperlipidemia, unspecified: Secondary | ICD-10-CM | POA: Insufficient documentation

## 2017-08-18 DIAGNOSIS — D72829 Elevated white blood cell count, unspecified: Secondary | ICD-10-CM | POA: Insufficient documentation

## 2017-08-18 DIAGNOSIS — R531 Weakness: Secondary | ICD-10-CM | POA: Diagnosis not present

## 2017-08-18 DIAGNOSIS — N528 Other male erectile dysfunction: Secondary | ICD-10-CM | POA: Insufficient documentation

## 2017-08-18 DIAGNOSIS — E291 Testicular hypofunction: Secondary | ICD-10-CM | POA: Insufficient documentation

## 2017-08-18 DIAGNOSIS — Z8673 Personal history of transient ischemic attack (TIA), and cerebral infarction without residual deficits: Secondary | ICD-10-CM | POA: Insufficient documentation

## 2017-08-18 DIAGNOSIS — Z87828 Personal history of other (healed) physical injury and trauma: Secondary | ICD-10-CM | POA: Insufficient documentation

## 2017-08-18 DIAGNOSIS — Z85828 Personal history of other malignant neoplasm of skin: Secondary | ICD-10-CM | POA: Insufficient documentation

## 2017-08-18 LAB — HEMOGLOBIN: HEMOGLOBIN: 13.4 g/dL (ref 13.0–18.0)

## 2017-08-18 LAB — HEMATOCRIT: HEMATOCRIT: 42.1 % (ref 40.0–52.0)

## 2017-09-06 ENCOUNTER — Ambulatory Visit (INDEPENDENT_AMBULATORY_CARE_PROVIDER_SITE_OTHER): Payer: Medicare Other | Admitting: Surgery

## 2017-09-06 ENCOUNTER — Encounter: Payer: Self-pay | Admitting: Surgery

## 2017-09-06 VITALS — BP 142/92 | HR 89 | Temp 97.9°F | Ht 70.0 in | Wt 205.0 lb

## 2017-09-06 DIAGNOSIS — Z4889 Encounter for other specified surgical aftercare: Secondary | ICD-10-CM

## 2017-09-06 DIAGNOSIS — K429 Umbilical hernia without obstruction or gangrene: Secondary | ICD-10-CM

## 2017-09-06 DIAGNOSIS — K4090 Unilateral inguinal hernia, without obstruction or gangrene, not specified as recurrent: Secondary | ICD-10-CM | POA: Diagnosis not present

## 2017-09-06 NOTE — Patient Instructions (Signed)
Please call office if you have questions or concerns.   You may swim. You can submerge in water. You can resume your normal activities.
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
## 2017-09-07 ENCOUNTER — Encounter: Payer: Self-pay | Admitting: Surgery

## 2017-09-15 ENCOUNTER — Inpatient Hospital Stay: Payer: Medicare Other

## 2017-09-15 ENCOUNTER — Encounter: Payer: Self-pay | Admitting: Oncology

## 2017-09-15 ENCOUNTER — Inpatient Hospital Stay (HOSPITAL_BASED_OUTPATIENT_CLINIC_OR_DEPARTMENT_OTHER): Payer: Medicare Other | Admitting: Oncology

## 2017-09-15 ENCOUNTER — Other Ambulatory Visit: Payer: Self-pay

## 2017-09-15 VITALS — BP 137/96 | HR 92 | Temp 97.7°F | Resp 18 | Wt 208.8 lb

## 2017-09-15 DIAGNOSIS — D45 Polycythemia vera: Secondary | ICD-10-CM

## 2017-09-15 DIAGNOSIS — G4700 Insomnia, unspecified: Secondary | ICD-10-CM

## 2017-09-15 DIAGNOSIS — D473 Essential (hemorrhagic) thrombocythemia: Secondary | ICD-10-CM | POA: Diagnosis not present

## 2017-09-15 DIAGNOSIS — D72829 Elevated white blood cell count, unspecified: Secondary | ICD-10-CM | POA: Diagnosis not present

## 2017-09-15 DIAGNOSIS — Z7982 Long term (current) use of aspirin: Secondary | ICD-10-CM

## 2017-09-15 DIAGNOSIS — N528 Other male erectile dysfunction: Secondary | ICD-10-CM

## 2017-09-15 DIAGNOSIS — E785 Hyperlipidemia, unspecified: Secondary | ICD-10-CM

## 2017-09-15 DIAGNOSIS — Z79899 Other long term (current) drug therapy: Secondary | ICD-10-CM

## 2017-09-15 DIAGNOSIS — E291 Testicular hypofunction: Secondary | ICD-10-CM

## 2017-09-15 DIAGNOSIS — D75839 Thrombocytosis, unspecified: Secondary | ICD-10-CM

## 2017-09-15 DIAGNOSIS — Z87828 Personal history of other (healed) physical injury and trauma: Secondary | ICD-10-CM

## 2017-09-15 DIAGNOSIS — I10 Essential (primary) hypertension: Secondary | ICD-10-CM

## 2017-09-15 DIAGNOSIS — R531 Weakness: Secondary | ICD-10-CM | POA: Diagnosis not present

## 2017-09-15 LAB — CBC WITH DIFFERENTIAL/PLATELET
BASOS ABS: 0.2 10*3/uL — AB (ref 0–0.1)
Basophils Relative: 1 %
EOS ABS: 0.4 10*3/uL (ref 0–0.7)
Eosinophils Relative: 2 %
HEMATOCRIT: 44.4 % (ref 40.0–52.0)
HEMOGLOBIN: 14.1 g/dL (ref 13.0–18.0)
LYMPHS ABS: 2.4 10*3/uL (ref 1.0–3.6)
Lymphocytes Relative: 14 %
MCH: 25.8 pg — ABNORMAL LOW (ref 26.0–34.0)
MCHC: 31.9 g/dL — ABNORMAL LOW (ref 32.0–36.0)
MCV: 80.9 fL (ref 80.0–100.0)
MONOS PCT: 9 %
Monocytes Absolute: 1.5 10*3/uL — ABNORMAL HIGH (ref 0.2–1.0)
NEUTROS ABS: 12.9 10*3/uL — AB (ref 1.4–6.5)
NEUTROS PCT: 74 %
Platelets: 979 10*3/uL (ref 150–440)
RBC: 5.49 MIL/uL (ref 4.40–5.90)
RDW: 19.9 % — AB (ref 11.5–14.5)
WBC: 17.4 10*3/uL — ABNORMAL HIGH (ref 3.8–10.6)
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
## 2017-09-15 NOTE — Progress Notes (Signed)
Patient here for follow up. Patient had a sore throat for about 3 weeks and is unsure if it is related to medication. It feels better but its still sore. Pt has has had 2 hernia surgeries about a month ago (July 12).

## 2017-09-22 ENCOUNTER — Inpatient Hospital Stay: Payer: Medicare Other | Attending: Oncology

## 2017-09-22 ENCOUNTER — Other Ambulatory Visit: Payer: Self-pay | Admitting: Oncology

## 2017-09-22 DIAGNOSIS — E291 Testicular hypofunction: Secondary | ICD-10-CM | POA: Diagnosis not present

## 2017-09-22 DIAGNOSIS — E785 Hyperlipidemia, unspecified: Secondary | ICD-10-CM | POA: Diagnosis not present

## 2017-09-22 DIAGNOSIS — D508 Other iron deficiency anemias: Secondary | ICD-10-CM | POA: Diagnosis not present

## 2017-09-22 DIAGNOSIS — Z79899 Other long term (current) drug therapy: Secondary | ICD-10-CM | POA: Insufficient documentation

## 2017-09-22 DIAGNOSIS — Z7982 Long term (current) use of aspirin: Secondary | ICD-10-CM | POA: Diagnosis not present

## 2017-09-22 DIAGNOSIS — D72829 Elevated white blood cell count, unspecified: Secondary | ICD-10-CM | POA: Insufficient documentation

## 2017-09-22 DIAGNOSIS — R5383 Other fatigue: Secondary | ICD-10-CM | POA: Diagnosis not present

## 2017-09-22 DIAGNOSIS — D45 Polycythemia vera: Secondary | ICD-10-CM | POA: Insufficient documentation

## 2017-09-22 DIAGNOSIS — Z9081 Acquired absence of spleen: Secondary | ICD-10-CM | POA: Insufficient documentation

## 2017-09-22 DIAGNOSIS — I10 Essential (primary) hypertension: Secondary | ICD-10-CM | POA: Diagnosis not present

## 2017-09-22 DIAGNOSIS — Z8673 Personal history of transient ischemic attack (TIA), and cerebral infarction without residual deficits: Secondary | ICD-10-CM | POA: Insufficient documentation

## 2017-09-22 DIAGNOSIS — K5900 Constipation, unspecified: Secondary | ICD-10-CM | POA: Insufficient documentation

## 2017-09-22 DIAGNOSIS — R5381 Other malaise: Secondary | ICD-10-CM | POA: Diagnosis not present

## 2017-09-22 DIAGNOSIS — D473 Essential (hemorrhagic) thrombocythemia: Secondary | ICD-10-CM | POA: Diagnosis not present

## 2017-09-22 LAB — CBC WITH DIFFERENTIAL/PLATELET
Basophils Absolute: 0.1 10*3/uL (ref 0–0.1)
Basophils Relative: 1 %
EOS PCT: 2 %
Eosinophils Absolute: 0.4 10*3/uL (ref 0–0.7)
HEMATOCRIT: 45.1 % (ref 40.0–52.0)
Hemoglobin: 14.3 g/dL (ref 13.0–18.0)
LYMPHS ABS: 2.1 10*3/uL (ref 1.0–3.6)
Lymphocytes Relative: 12 %
MCH: 25.2 pg — AB (ref 26.0–34.0)
MCHC: 31.6 g/dL — ABNORMAL LOW (ref 32.0–36.0)
MCV: 79.6 fL — ABNORMAL LOW (ref 80.0–100.0)
MONO ABS: 1 10*3/uL (ref 0.2–1.0)
MONOS PCT: 6 %
NEUTROS ABS: 13.6 10*3/uL — AB (ref 1.4–6.5)
Neutrophils Relative %: 79 %
PLATELETS: 995 10*3/uL — AB (ref 150–440)
RBC: 5.67 MIL/uL (ref 4.40–5.90)
RDW: 19.7 % — AB (ref 11.5–14.5)
WBC: 17.3 10*3/uL — ABNORMAL HIGH (ref 3.8–10.6)

## 2017-09-22 MED ORDER — HYDROXYUREA 500 MG PO CAPS: 500.0000 mg | ORAL_CAPSULE | Freq: Every day | ORAL | 0 refills | Status: DC

## 2017-09-22 NOTE — Progress Notes (Signed)
Discussed with patient that I recommend him to restart Hydroxyurea 500mg  daily. Possible side effects discussed. He is aware about side effects and understands the rationale of restart Hydrea. Will see him in 2 weeks.

## 2017-09-29 ENCOUNTER — Inpatient Hospital Stay: Payer: Medicare Other

## 2017-10-06 ENCOUNTER — Inpatient Hospital Stay: Payer: Medicare Other

## 2017-10-06 ENCOUNTER — Other Ambulatory Visit: Payer: Self-pay | Admitting: *Deleted

## 2017-10-06 ENCOUNTER — Other Ambulatory Visit: Payer: Self-pay

## 2017-10-06 ENCOUNTER — Inpatient Hospital Stay (HOSPITAL_BASED_OUTPATIENT_CLINIC_OR_DEPARTMENT_OTHER): Payer: Medicare Other | Admitting: Oncology

## 2017-10-06 ENCOUNTER — Encounter: Payer: Self-pay | Admitting: Oncology

## 2017-10-06 VITALS — BP 153/92 | HR 89 | Temp 97.3°F | Wt 212.7 lb

## 2017-10-06 DIAGNOSIS — D72829 Elevated white blood cell count, unspecified: Secondary | ICD-10-CM | POA: Diagnosis not present

## 2017-10-06 DIAGNOSIS — K5900 Constipation, unspecified: Secondary | ICD-10-CM

## 2017-10-06 DIAGNOSIS — Z5111 Encounter for antineoplastic chemotherapy: Secondary | ICD-10-CM

## 2017-10-06 DIAGNOSIS — Z9081 Acquired absence of spleen: Secondary | ICD-10-CM

## 2017-10-06 DIAGNOSIS — I10 Essential (primary) hypertension: Secondary | ICD-10-CM

## 2017-10-06 DIAGNOSIS — D508 Other iron deficiency anemias: Secondary | ICD-10-CM

## 2017-10-06 DIAGNOSIS — D473 Essential (hemorrhagic) thrombocythemia: Secondary | ICD-10-CM

## 2017-10-06 DIAGNOSIS — R5381 Other malaise: Secondary | ICD-10-CM

## 2017-10-06 DIAGNOSIS — D45 Polycythemia vera: Secondary | ICD-10-CM

## 2017-10-06 DIAGNOSIS — Z7982 Long term (current) use of aspirin: Secondary | ICD-10-CM

## 2017-10-06 DIAGNOSIS — R5383 Other fatigue: Secondary | ICD-10-CM

## 2017-10-06 DIAGNOSIS — Z79899 Other long term (current) drug therapy: Secondary | ICD-10-CM

## 2017-10-06 DIAGNOSIS — D75839 Thrombocytosis, unspecified: Secondary | ICD-10-CM

## 2017-10-06 DIAGNOSIS — E785 Hyperlipidemia, unspecified: Secondary | ICD-10-CM

## 2017-10-06 DIAGNOSIS — E291 Testicular hypofunction: Secondary | ICD-10-CM

## 2017-10-06 DIAGNOSIS — Z8673 Personal history of transient ischemic attack (TIA), and cerebral infarction without residual deficits: Secondary | ICD-10-CM

## 2017-10-06 LAB — CBC WITH DIFFERENTIAL/PLATELET
BASOS ABS: 0.2 10*3/uL — AB (ref 0–0.1)
BASOS PCT: 1 %
Eosinophils Absolute: 0.3 10*3/uL (ref 0–0.7)
Eosinophils Relative: 2 %
HCT: 44.1 % (ref 40.0–52.0)
Hemoglobin: 13.8 g/dL (ref 13.0–18.0)
LYMPHS PCT: 11 %
Lymphs Abs: 2 10*3/uL (ref 1.0–3.6)
MCH: 24.6 pg — ABNORMAL LOW (ref 26.0–34.0)
MCHC: 31.3 g/dL — ABNORMAL LOW (ref 32.0–36.0)
MCV: 78.5 fL — AB (ref 80.0–100.0)
Monocytes Absolute: 1.2 10*3/uL — ABNORMAL HIGH (ref 0.2–1.0)
Monocytes Relative: 7 %
NEUTROS PCT: 79 %
Neutro Abs: 14 10*3/uL — ABNORMAL HIGH (ref 1.4–6.5)
Platelets: 892 10*3/uL — ABNORMAL HIGH (ref 150–440)
RBC: 5.61 MIL/uL (ref 4.40–5.90)
RDW: 21.1 % — ABNORMAL HIGH (ref 11.5–14.5)
WBC: 17.7 10*3/uL — ABNORMAL HIGH (ref 3.8–10.6)

## 2017-10-06 LAB — TECHNOLOGIST SMEAR REVIEW

## 2017-10-06 MED ORDER — FERROUS SULFATE 325 (65 FE) MG PO TBEC: 325.0000 mg | DELAYED_RELEASE_TABLET | Freq: Every day | ORAL | 0 refills | Status: DC
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
## 2017-10-06 NOTE — Progress Notes (Signed)
Patient here today for follow up.   

## 2017-10-13 ENCOUNTER — Ambulatory Visit: Payer: Medicare Other | Admitting: Oncology

## 2017-10-13 ENCOUNTER — Other Ambulatory Visit: Payer: Medicare Other

## 2017-10-20 ENCOUNTER — Inpatient Hospital Stay: Payer: Medicare Other | Attending: Oncology

## 2017-10-20 ENCOUNTER — Inpatient Hospital Stay: Payer: Medicare Other

## 2017-10-20 ENCOUNTER — Inpatient Hospital Stay: Payer: Medicare Other | Admitting: Oncology

## 2017-10-20 ENCOUNTER — Ambulatory Visit: Payer: Medicare Other | Admitting: Oncology

## 2017-10-20 DIAGNOSIS — Z79899 Other long term (current) drug therapy: Secondary | ICD-10-CM | POA: Insufficient documentation

## 2017-10-20 DIAGNOSIS — D45 Polycythemia vera: Secondary | ICD-10-CM | POA: Insufficient documentation

## 2017-10-20 DIAGNOSIS — Z7982 Long term (current) use of aspirin: Secondary | ICD-10-CM | POA: Insufficient documentation

## 2017-10-20 DIAGNOSIS — D508 Other iron deficiency anemias: Secondary | ICD-10-CM | POA: Insufficient documentation

## 2017-10-20 DIAGNOSIS — D72829 Elevated white blood cell count, unspecified: Secondary | ICD-10-CM | POA: Insufficient documentation

## 2017-10-20 DIAGNOSIS — I10 Essential (primary) hypertension: Secondary | ICD-10-CM | POA: Insufficient documentation

## 2017-10-20 DIAGNOSIS — Z9081 Acquired absence of spleen: Secondary | ICD-10-CM | POA: Insufficient documentation

## 2017-10-20 DIAGNOSIS — E291 Testicular hypofunction: Secondary | ICD-10-CM | POA: Insufficient documentation

## 2017-10-20 DIAGNOSIS — R5383 Other fatigue: Secondary | ICD-10-CM | POA: Insufficient documentation

## 2017-10-20 DIAGNOSIS — D473 Essential (hemorrhagic) thrombocythemia: Secondary | ICD-10-CM | POA: Insufficient documentation

## 2017-10-20 DIAGNOSIS — R5381 Other malaise: Secondary | ICD-10-CM | POA: Insufficient documentation

## 2017-10-20 DIAGNOSIS — R531 Weakness: Secondary | ICD-10-CM | POA: Insufficient documentation

## 2017-10-20 DIAGNOSIS — G4700 Insomnia, unspecified: Secondary | ICD-10-CM | POA: Insufficient documentation

## 2017-10-20 DIAGNOSIS — E785 Hyperlipidemia, unspecified: Secondary | ICD-10-CM | POA: Insufficient documentation

## 2017-10-20 DIAGNOSIS — Z8673 Personal history of transient ischemic attack (TIA), and cerebral infarction without residual deficits: Secondary | ICD-10-CM | POA: Insufficient documentation

## 2017-10-21 ENCOUNTER — Other Ambulatory Visit: Payer: Medicare Other

## 2017-10-21 ENCOUNTER — Inpatient Hospital Stay: Payer: Medicare Other

## 2017-10-21 DIAGNOSIS — E291 Testicular hypofunction: Secondary | ICD-10-CM | POA: Diagnosis not present

## 2017-10-21 DIAGNOSIS — E785 Hyperlipidemia, unspecified: Secondary | ICD-10-CM | POA: Diagnosis not present

## 2017-10-21 DIAGNOSIS — R5381 Other malaise: Secondary | ICD-10-CM | POA: Diagnosis not present

## 2017-10-21 DIAGNOSIS — I10 Essential (primary) hypertension: Secondary | ICD-10-CM | POA: Diagnosis not present

## 2017-10-21 DIAGNOSIS — G4700 Insomnia, unspecified: Secondary | ICD-10-CM | POA: Diagnosis not present

## 2017-10-21 DIAGNOSIS — Z8673 Personal history of transient ischemic attack (TIA), and cerebral infarction without residual deficits: Secondary | ICD-10-CM | POA: Diagnosis not present

## 2017-10-21 DIAGNOSIS — Z9081 Acquired absence of spleen: Secondary | ICD-10-CM | POA: Diagnosis not present

## 2017-10-21 DIAGNOSIS — R531 Weakness: Secondary | ICD-10-CM | POA: Diagnosis not present

## 2017-10-21 DIAGNOSIS — D72829 Elevated white blood cell count, unspecified: Secondary | ICD-10-CM | POA: Diagnosis not present

## 2017-10-21 DIAGNOSIS — Z79899 Other long term (current) drug therapy: Secondary | ICD-10-CM | POA: Diagnosis not present

## 2017-10-21 DIAGNOSIS — R5383 Other fatigue: Secondary | ICD-10-CM | POA: Diagnosis not present

## 2017-10-21 DIAGNOSIS — D508 Other iron deficiency anemias: Secondary | ICD-10-CM | POA: Diagnosis not present

## 2017-10-21 DIAGNOSIS — D473 Essential (hemorrhagic) thrombocythemia: Secondary | ICD-10-CM | POA: Diagnosis not present

## 2017-10-21 DIAGNOSIS — D45 Polycythemia vera: Secondary | ICD-10-CM | POA: Diagnosis not present

## 2017-10-21 DIAGNOSIS — Z7982 Long term (current) use of aspirin: Secondary | ICD-10-CM | POA: Diagnosis not present

## 2017-10-21 LAB — COMPREHENSIVE METABOLIC PANEL
ALBUMIN: 3.5 g/dL (ref 3.5–5.0)
ALT: 14 U/L (ref 0–44)
AST: 20 U/L (ref 15–41)
Alkaline Phosphatase: 103 U/L (ref 38–126)
Anion gap: 7 (ref 5–15)
BUN: 14 mg/dL (ref 6–20)
CHLORIDE: 104 mmol/L (ref 98–111)
CO2: 26 mmol/L (ref 22–32)
Calcium: 8.7 mg/dL — ABNORMAL LOW (ref 8.9–10.3)
Creatinine, Ser: 0.85 mg/dL (ref 0.61–1.24)
GFR calc non Af Amer: >60 mL/min (ref >60)
GLUCOSE: 129 mg/dL — AB (ref 70–99)
Potassium: 4 mmol/L (ref 3.5–5.1)
SODIUM: 137 mmol/L (ref 135–145)
Total Bilirubin: 0.7 mg/dL (ref 0.3–1.2)
Total Protein: 8.8 g/dL — ABNORMAL HIGH (ref 6.5–8.1)

## 2017-10-21 LAB — CBC WITH DIFFERENTIAL/PLATELET
BASOS ABS: 0.1 10*3/uL (ref 0–0.1)
Basophils Relative: 1 %
EOS PCT: 1 %
Eosinophils Absolute: 0.2 10*3/uL (ref 0–0.7)
HEMATOCRIT: 45.4 % (ref 40.0–52.0)
Hemoglobin: 14.5 g/dL (ref 13.0–18.0)
LYMPHS ABS: 1.9 10*3/uL (ref 1.0–3.6)
Lymphocytes Relative: 12 %
MCH: 24.7 pg — AB (ref 26.0–34.0)
MCHC: 31.9 g/dL — ABNORMAL LOW (ref 32.0–36.0)
MCV: 77.2 fL — ABNORMAL LOW (ref 80.0–100.0)
MONO ABS: 1 10*3/uL (ref 0.2–1.0)
Monocytes Relative: 6 %
NEUTROS ABS: 13.7 10*3/uL — AB (ref 1.4–6.5)
Neutrophils Relative %: 80 %
PLATELETS: 823 10*3/uL — AB (ref 150–440)
RBC: 5.88 MIL/uL (ref 4.40–5.90)
RDW: 21.8 % — ABNORMAL HIGH (ref 11.5–14.5)
WBC: 16.9 10*3/uL — AB (ref 3.8–10.6)

## 2017-10-25 LAB — COMP PANEL: LEUKEMIA/LYMPHOMA

## 2017-10-27 ENCOUNTER — Encounter: Payer: Self-pay | Admitting: Oncology

## 2017-10-27 ENCOUNTER — Other Ambulatory Visit: Payer: Self-pay

## 2017-10-27 ENCOUNTER — Inpatient Hospital Stay (HOSPITAL_BASED_OUTPATIENT_CLINIC_OR_DEPARTMENT_OTHER): Payer: Medicare Other | Admitting: Oncology

## 2017-10-27 VITALS — BP 139/75 | HR 92 | Temp 97.3°F | Wt 211.1 lb

## 2017-10-27 DIAGNOSIS — D75839 Thrombocytosis, unspecified: Secondary | ICD-10-CM

## 2017-10-27 DIAGNOSIS — E291 Testicular hypofunction: Secondary | ICD-10-CM

## 2017-10-27 DIAGNOSIS — R5383 Other fatigue: Secondary | ICD-10-CM

## 2017-10-27 DIAGNOSIS — Z9081 Acquired absence of spleen: Secondary | ICD-10-CM

## 2017-10-27 DIAGNOSIS — D508 Other iron deficiency anemias: Secondary | ICD-10-CM | POA: Diagnosis not present

## 2017-10-27 DIAGNOSIS — D72829 Elevated white blood cell count, unspecified: Secondary | ICD-10-CM | POA: Diagnosis not present

## 2017-10-27 DIAGNOSIS — Z8673 Personal history of transient ischemic attack (TIA), and cerebral infarction without residual deficits: Secondary | ICD-10-CM

## 2017-10-27 DIAGNOSIS — R5381 Other malaise: Secondary | ICD-10-CM

## 2017-10-27 DIAGNOSIS — E785 Hyperlipidemia, unspecified: Secondary | ICD-10-CM

## 2017-10-27 DIAGNOSIS — Z7982 Long term (current) use of aspirin: Secondary | ICD-10-CM

## 2017-10-27 DIAGNOSIS — D45 Polycythemia vera: Secondary | ICD-10-CM | POA: Diagnosis not present

## 2017-10-27 DIAGNOSIS — D473 Essential (hemorrhagic) thrombocythemia: Secondary | ICD-10-CM

## 2017-10-27 DIAGNOSIS — R531 Weakness: Secondary | ICD-10-CM

## 2017-10-27 DIAGNOSIS — G4700 Insomnia, unspecified: Secondary | ICD-10-CM

## 2017-10-27 DIAGNOSIS — I10 Essential (primary) hypertension: Secondary | ICD-10-CM

## 2017-10-27 DIAGNOSIS — Z79899 Other long term (current) drug therapy: Secondary | ICD-10-CM

## 2017-10-27 NOTE — Progress Notes (Signed)
Patient here today for follow up.  Patient c/o some dizziness since starting iron supplement, patient states that he does not take iron supplement with food.
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
## 2017-10-28 ENCOUNTER — Other Ambulatory Visit: Payer: Self-pay | Admitting: Oncology

## 2017-10-31 LAB — BCR-ABL1 FISH
CELLS ANALYZED: 200
Cells Counted: 200

## 2017-11-24 ENCOUNTER — Encounter: Payer: Self-pay | Admitting: Oncology

## 2017-11-24 ENCOUNTER — Inpatient Hospital Stay: Payer: Medicare Other

## 2017-11-24 ENCOUNTER — Inpatient Hospital Stay: Payer: Medicare Other | Admitting: Oncology

## 2017-11-25 ENCOUNTER — Other Ambulatory Visit: Payer: Self-pay | Admitting: Oncology

## 2017-11-25 NOTE — Telephone Encounter (Signed)
Did he no showed on his appt? Need reschedule. Thanks

## 2017-12-08 ENCOUNTER — Inpatient Hospital Stay: Payer: Medicare Other | Attending: Oncology

## 2017-12-08 DIAGNOSIS — D72829 Elevated white blood cell count, unspecified: Secondary | ICD-10-CM | POA: Insufficient documentation

## 2017-12-08 DIAGNOSIS — D473 Essential (hemorrhagic) thrombocythemia: Secondary | ICD-10-CM | POA: Diagnosis not present

## 2017-12-08 DIAGNOSIS — D508 Other iron deficiency anemias: Secondary | ICD-10-CM | POA: Insufficient documentation

## 2017-12-08 DIAGNOSIS — D45 Polycythemia vera: Secondary | ICD-10-CM | POA: Insufficient documentation

## 2017-12-08 LAB — CBC WITH DIFFERENTIAL/PLATELET
ABS IMMATURE GRANULOCYTES: 0.09 10*3/uL — AB (ref 0.00–0.07)
BASOS ABS: 0.2 10*3/uL — AB (ref 0.0–0.1)
Basophils Relative: 1 %
Eosinophils Absolute: 0.3 10*3/uL (ref 0.0–0.5)
Eosinophils Relative: 2 %
HCT: 47.7 % (ref 39.0–52.0)
HEMOGLOBIN: 15 g/dL (ref 13.0–17.0)
Immature Granulocytes: 1 %
LYMPHS PCT: 15 %
Lymphs Abs: 2.4 10*3/uL (ref 0.7–4.0)
MCH: 24.5 pg — ABNORMAL LOW (ref 26.0–34.0)
MCHC: 31.4 g/dL (ref 30.0–36.0)
MCV: 77.9 fL — ABNORMAL LOW (ref 80.0–100.0)
MONO ABS: 1.2 10*3/uL — AB (ref 0.1–1.0)
Monocytes Relative: 7 %
NEUTROS ABS: 12.2 10*3/uL — AB (ref 1.7–7.7)
NRBC: 0.1 % (ref 0.0–0.2)
Neutrophils Relative %: 74 %
Platelets: 682 10*3/uL — ABNORMAL HIGH (ref 150–400)
RBC: 6.12 MIL/uL — AB (ref 4.22–5.81)
RDW: 24.5 % — ABNORMAL HIGH (ref 11.5–15.5)
WBC: 16.3 10*3/uL — AB (ref 4.0–10.5)

## 2017-12-22 ENCOUNTER — Inpatient Hospital Stay: Payer: Medicare Other | Attending: Oncology | Admitting: Oncology

## 2017-12-22 ENCOUNTER — Encounter: Payer: Self-pay | Admitting: Oncology

## 2017-12-22 ENCOUNTER — Other Ambulatory Visit: Payer: Self-pay

## 2017-12-22 ENCOUNTER — Inpatient Hospital Stay: Payer: Medicare Other

## 2017-12-22 VITALS — BP 143/104 | HR 91 | Temp 97.0°F | Resp 18 | Wt 215.4 lb

## 2017-12-22 DIAGNOSIS — N529 Male erectile dysfunction, unspecified: Secondary | ICD-10-CM | POA: Insufficient documentation

## 2017-12-22 DIAGNOSIS — G4700 Insomnia, unspecified: Secondary | ICD-10-CM | POA: Insufficient documentation

## 2017-12-22 DIAGNOSIS — Z7982 Long term (current) use of aspirin: Secondary | ICD-10-CM | POA: Diagnosis not present

## 2017-12-22 DIAGNOSIS — R531 Weakness: Secondary | ICD-10-CM | POA: Diagnosis not present

## 2017-12-22 DIAGNOSIS — R5383 Other fatigue: Secondary | ICD-10-CM | POA: Insufficient documentation

## 2017-12-22 DIAGNOSIS — E291 Testicular hypofunction: Secondary | ICD-10-CM | POA: Insufficient documentation

## 2017-12-22 DIAGNOSIS — I10 Essential (primary) hypertension: Secondary | ICD-10-CM | POA: Diagnosis not present

## 2017-12-22 DIAGNOSIS — D72821 Monocytosis (symptomatic): Secondary | ICD-10-CM | POA: Insufficient documentation

## 2017-12-22 DIAGNOSIS — Z79899 Other long term (current) drug therapy: Secondary | ICD-10-CM | POA: Diagnosis not present

## 2017-12-22 DIAGNOSIS — Z9081 Acquired absence of spleen: Secondary | ICD-10-CM | POA: Insufficient documentation

## 2017-12-22 DIAGNOSIS — E611 Iron deficiency: Secondary | ICD-10-CM | POA: Diagnosis not present

## 2017-12-22 DIAGNOSIS — D45 Polycythemia vera: Secondary | ICD-10-CM | POA: Diagnosis not present

## 2017-12-22 DIAGNOSIS — Z791 Long term (current) use of non-steroidal anti-inflammatories (NSAID): Secondary | ICD-10-CM | POA: Diagnosis not present

## 2017-12-22 DIAGNOSIS — Z8673 Personal history of transient ischemic attack (TIA), and cerebral infarction without residual deficits: Secondary | ICD-10-CM | POA: Diagnosis not present

## 2017-12-22 DIAGNOSIS — E785 Hyperlipidemia, unspecified: Secondary | ICD-10-CM | POA: Insufficient documentation

## 2017-12-22 LAB — COMPREHENSIVE METABOLIC PANEL
ALBUMIN: 3.5 g/dL (ref 3.5–5.0)
ALT: 14 U/L (ref 0–44)
AST: 20 U/L (ref 15–41)
Alkaline Phosphatase: 100 U/L (ref 38–126)
Anion gap: 6 (ref 5–15)
BUN: 13 mg/dL (ref 6–20)
CHLORIDE: 104 mmol/L (ref 98–111)
CO2: 28 mmol/L (ref 22–32)
Calcium: 8.5 mg/dL — ABNORMAL LOW (ref 8.9–10.3)
Creatinine, Ser: 0.9 mg/dL (ref 0.61–1.24)
GFR calc Af Amer: >60 mL/min (ref >60)
GFR calc non Af Amer: >60 mL/min (ref >60)
GLUCOSE: 104 mg/dL — AB (ref 70–99)
POTASSIUM: 4.1 mmol/L (ref 3.5–5.1)
Sodium: 138 mmol/L (ref 135–145)
Total Bilirubin: 0.9 mg/dL (ref 0.3–1.2)
Total Protein: 8.1 g/dL (ref 6.5–8.1)

## 2017-12-22 LAB — CBC WITH DIFFERENTIAL/PLATELET
ABS IMMATURE GRANULOCYTES: 0.1 10*3/uL — AB (ref 0.00–0.07)
Basophils Absolute: 0.2 10*3/uL — ABNORMAL HIGH (ref 0.0–0.1)
Basophils Relative: 1 %
Eosinophils Absolute: 0.3 10*3/uL (ref 0.0–0.5)
Eosinophils Relative: 2 %
HCT: 46.5 % (ref 39.0–52.0)
HEMOGLOBIN: 14.4 g/dL (ref 13.0–17.0)
IMMATURE GRANULOCYTES: 1 %
Lymphocytes Relative: 10 %
Lymphs Abs: 1.7 10*3/uL (ref 0.7–4.0)
MCH: 24.4 pg — AB (ref 26.0–34.0)
MCHC: 31 g/dL (ref 30.0–36.0)
MCV: 78.9 fL — AB (ref 80.0–100.0)
MONO ABS: 1.5 10*3/uL — AB (ref 0.1–1.0)
MONOS PCT: 9 %
NEUTROS ABS: 12.4 10*3/uL — AB (ref 1.7–7.7)
NEUTROS PCT: 77 %
PLATELETS: 664 10*3/uL — AB (ref 150–400)
RBC: 5.89 MIL/uL — ABNORMAL HIGH (ref 4.22–5.81)
RDW: 24.2 % — ABNORMAL HIGH (ref 11.5–15.5)
WBC: 16.1 10*3/uL — ABNORMAL HIGH (ref 4.0–10.5)
nRBC: 0.1 % (ref 0.0–0.2)
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
## 2017-12-22 NOTE — Progress Notes (Signed)
Patient here for follow up. Pt states feeling weaker ever since he has been off of iron pills. Pts blood pressure is elevated; denies headache or dizziness.

## 2018-02-23 ENCOUNTER — Inpatient Hospital Stay: Payer: Medicare Other

## 2018-02-23 ENCOUNTER — Encounter: Payer: Self-pay | Admitting: Oncology

## 2018-02-23 ENCOUNTER — Other Ambulatory Visit: Payer: Self-pay

## 2018-02-23 ENCOUNTER — Inpatient Hospital Stay: Payer: Medicare Other | Attending: Oncology | Admitting: Oncology

## 2018-02-23 VITALS — BP 125/86 | HR 102 | Temp 98.0°F | Resp 16 | Ht 73.0 in | Wt 224.7 lb

## 2018-02-23 DIAGNOSIS — D72829 Elevated white blood cell count, unspecified: Secondary | ICD-10-CM | POA: Insufficient documentation

## 2018-02-23 DIAGNOSIS — N529 Male erectile dysfunction, unspecified: Secondary | ICD-10-CM | POA: Insufficient documentation

## 2018-02-23 DIAGNOSIS — Z8673 Personal history of transient ischemic attack (TIA), and cerebral infarction without residual deficits: Secondary | ICD-10-CM | POA: Diagnosis not present

## 2018-02-23 DIAGNOSIS — D45 Polycythemia vera: Secondary | ICD-10-CM | POA: Diagnosis present

## 2018-02-23 DIAGNOSIS — I10 Essential (primary) hypertension: Secondary | ICD-10-CM | POA: Insufficient documentation

## 2018-02-23 DIAGNOSIS — Z9081 Acquired absence of spleen: Secondary | ICD-10-CM

## 2018-02-23 DIAGNOSIS — D75839 Thrombocytosis, unspecified: Secondary | ICD-10-CM

## 2018-02-23 DIAGNOSIS — D473 Essential (hemorrhagic) thrombocythemia: Secondary | ICD-10-CM

## 2018-02-23 DIAGNOSIS — Z791 Long term (current) use of non-steroidal anti-inflammatories (NSAID): Secondary | ICD-10-CM | POA: Diagnosis not present

## 2018-02-23 DIAGNOSIS — G4700 Insomnia, unspecified: Secondary | ICD-10-CM | POA: Insufficient documentation

## 2018-02-23 DIAGNOSIS — E291 Testicular hypofunction: Secondary | ICD-10-CM | POA: Insufficient documentation

## 2018-02-23 DIAGNOSIS — R5383 Other fatigue: Secondary | ICD-10-CM | POA: Diagnosis not present

## 2018-02-23 DIAGNOSIS — Z79899 Other long term (current) drug therapy: Secondary | ICD-10-CM | POA: Diagnosis not present

## 2018-02-23 DIAGNOSIS — D508 Other iron deficiency anemias: Secondary | ICD-10-CM | POA: Diagnosis not present

## 2018-02-23 DIAGNOSIS — Z7982 Long term (current) use of aspirin: Secondary | ICD-10-CM | POA: Diagnosis not present

## 2018-02-23 DIAGNOSIS — E785 Hyperlipidemia, unspecified: Secondary | ICD-10-CM | POA: Diagnosis not present

## 2018-02-23 DIAGNOSIS — R7989 Other specified abnormal findings of blood chemistry: Secondary | ICD-10-CM | POA: Insufficient documentation

## 2018-02-23 DIAGNOSIS — R531 Weakness: Secondary | ICD-10-CM | POA: Diagnosis not present

## 2018-02-23 LAB — CBC WITH DIFFERENTIAL/PLATELET
Abs Immature Granulocytes: 0.11 10*3/uL — ABNORMAL HIGH (ref 0.00–0.07)
Basophils Absolute: 0.4 10*3/uL — ABNORMAL HIGH (ref 0.0–0.1)
Basophils Relative: 2 %
Eosinophils Absolute: 0.2 10*3/uL (ref 0.0–0.5)
Eosinophils Relative: 1 %
HEMATOCRIT: 47.9 % (ref 39.0–52.0)
HEMOGLOBIN: 14.6 g/dL (ref 13.0–17.0)
Immature Granulocytes: 1 %
LYMPHS ABS: 2 10*3/uL (ref 0.7–4.0)
LYMPHS PCT: 9 %
MCH: 24.6 pg — AB (ref 26.0–34.0)
MCHC: 30.5 g/dL (ref 30.0–36.0)
MCV: 80.6 fL (ref 80.0–100.0)
MONO ABS: 1.6 10*3/uL — AB (ref 0.1–1.0)
MONOS PCT: 7 %
NEUTROS ABS: 17.7 10*3/uL — AB (ref 1.7–7.7)
Neutrophils Relative %: 80 %
Platelets: 606 10*3/uL — ABNORMAL HIGH (ref 150–400)
RBC: 5.94 MIL/uL — ABNORMAL HIGH (ref 4.22–5.81)
RDW: 20.8 % — ABNORMAL HIGH (ref 11.5–15.5)
WBC: 21.9 10*3/uL — ABNORMAL HIGH (ref 4.0–10.5)
nRBC: 0.2 % (ref 0.0–0.2)

## 2018-02-23 LAB — COMPREHENSIVE METABOLIC PANEL
ALBUMIN: 3.9 g/dL (ref 3.5–5.0)
ALT: 16 U/L (ref 0–44)
AST: 22 U/L (ref 15–41)
Alkaline Phosphatase: 111 U/L (ref 38–126)
Anion gap: 5 (ref 5–15)
BILIRUBIN TOTAL: 1.1 mg/dL (ref 0.3–1.2)
BUN: 15 mg/dL (ref 6–20)
CO2: 28 mmol/L (ref 22–32)
Calcium: 8.9 mg/dL (ref 8.9–10.3)
Chloride: 108 mmol/L (ref 98–111)
Creatinine, Ser: 0.95 mg/dL (ref 0.61–1.24)
GFR calc Af Amer: >60 mL/min (ref >60)
GFR calc non Af Amer: >60 mL/min (ref >60)
GLUCOSE: 102 mg/dL — AB (ref 70–99)
POTASSIUM: 3.6 mmol/L (ref 3.5–5.1)
Sodium: 141 mmol/L (ref 135–145)
TOTAL PROTEIN: 8 g/dL (ref 6.5–8.1)

## 2018-02-23 MED ORDER — HYDROXYUREA 500 MG PO CAPS: ORAL_CAPSULE | ORAL | 0 refills | Status: DC

## 2018-02-23 NOTE — Progress Notes (Signed)
Patient here for follow up. Patient reports weakness.
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
## 2018-03-22 ENCOUNTER — Other Ambulatory Visit: Payer: Self-pay

## 2018-03-22 ENCOUNTER — Inpatient Hospital Stay (HOSPITAL_BASED_OUTPATIENT_CLINIC_OR_DEPARTMENT_OTHER): Payer: Medicare Other | Admitting: Oncology

## 2018-03-22 ENCOUNTER — Encounter: Payer: Self-pay | Admitting: Oncology

## 2018-03-22 ENCOUNTER — Inpatient Hospital Stay: Payer: Medicare Other | Attending: Oncology

## 2018-03-22 VITALS — BP 146/87 | HR 100 | Temp 97.5°F | Resp 18 | Wt 220.9 lb

## 2018-03-22 DIAGNOSIS — E785 Hyperlipidemia, unspecified: Secondary | ICD-10-CM | POA: Diagnosis not present

## 2018-03-22 DIAGNOSIS — I10 Essential (primary) hypertension: Secondary | ICD-10-CM | POA: Diagnosis not present

## 2018-03-22 DIAGNOSIS — Z5111 Encounter for antineoplastic chemotherapy: Secondary | ICD-10-CM

## 2018-03-22 DIAGNOSIS — R7989 Other specified abnormal findings of blood chemistry: Secondary | ICD-10-CM

## 2018-03-22 DIAGNOSIS — D45 Polycythemia vera: Secondary | ICD-10-CM

## 2018-03-22 DIAGNOSIS — Z9081 Acquired absence of spleen: Secondary | ICD-10-CM | POA: Diagnosis not present

## 2018-03-22 DIAGNOSIS — D508 Other iron deficiency anemias: Secondary | ICD-10-CM | POA: Insufficient documentation

## 2018-03-22 DIAGNOSIS — R5383 Other fatigue: Secondary | ICD-10-CM | POA: Diagnosis not present

## 2018-03-22 DIAGNOSIS — Z7982 Long term (current) use of aspirin: Secondary | ICD-10-CM | POA: Diagnosis not present

## 2018-03-22 DIAGNOSIS — D72829 Elevated white blood cell count, unspecified: Secondary | ICD-10-CM

## 2018-03-22 DIAGNOSIS — E291 Testicular hypofunction: Secondary | ICD-10-CM | POA: Insufficient documentation

## 2018-03-22 DIAGNOSIS — D473 Essential (hemorrhagic) thrombocythemia: Secondary | ICD-10-CM

## 2018-03-22 DIAGNOSIS — Z79899 Other long term (current) drug therapy: Secondary | ICD-10-CM | POA: Insufficient documentation

## 2018-03-22 DIAGNOSIS — D75839 Thrombocytosis, unspecified: Secondary | ICD-10-CM

## 2018-03-22 DIAGNOSIS — Z791 Long term (current) use of non-steroidal anti-inflammatories (NSAID): Secondary | ICD-10-CM | POA: Insufficient documentation

## 2018-03-22 LAB — CBC WITH DIFFERENTIAL/PLATELET
Abs Immature Granulocytes: 0.05 10*3/uL (ref 0.00–0.07)
BASOS PCT: 1 %
Basophils Absolute: 0.2 10*3/uL — ABNORMAL HIGH (ref 0.0–0.1)
Eosinophils Absolute: 0.3 10*3/uL (ref 0.0–0.5)
Eosinophils Relative: 1 %
HCT: 45.4 % (ref 39.0–52.0)
Hemoglobin: 14.2 g/dL (ref 13.0–17.0)
Immature Granulocytes: 0 %
Lymphocytes Relative: 12 %
Lymphs Abs: 2.2 10*3/uL (ref 0.7–4.0)
MCH: 25.2 pg — ABNORMAL LOW (ref 26.0–34.0)
MCHC: 31.3 g/dL (ref 30.0–36.0)
MCV: 80.5 fL (ref 80.0–100.0)
Monocytes Absolute: 1.2 10*3/uL — ABNORMAL HIGH (ref 0.1–1.0)
Monocytes Relative: 7 %
Neutro Abs: 14 10*3/uL — ABNORMAL HIGH (ref 1.7–7.7)
Neutrophils Relative %: 79 %
PLATELETS: 683 10*3/uL — AB (ref 150–400)
RBC: 5.64 MIL/uL (ref 4.22–5.81)
RDW: 20.9 % — ABNORMAL HIGH (ref 11.5–15.5)
WBC: 17.9 10*3/uL — AB (ref 4.0–10.5)
nRBC: 0.1 % (ref 0.0–0.2)

## 2018-03-22 MED ORDER — HYDROXYUREA 500 MG PO CAPS: ORAL_CAPSULE | ORAL | 0 refills | Status: DC
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
## 2018-03-22 NOTE — Progress Notes (Signed)
Patient here for follow up. He was out of the county for about 2 weeks in [place]. Denies fever, nausea, vomiting, diarrhea. Pt states that Hydrea never got sent to him pharmacy last time and is requesting resent/refill.

## 2018-04-17 ENCOUNTER — Other Ambulatory Visit: Payer: Self-pay | Admitting: *Deleted

## 2018-04-17 NOTE — Telephone Encounter (Signed)
T early for refill and will need labs prior

## 2018-05-15 ENCOUNTER — Other Ambulatory Visit: Payer: Self-pay

## 2018-05-16 ENCOUNTER — Inpatient Hospital Stay: Payer: Medicare Other | Admitting: *Deleted

## 2018-05-16 ENCOUNTER — Other Ambulatory Visit: Payer: Self-pay

## 2018-05-16 DIAGNOSIS — Z79899 Other long term (current) drug therapy: Secondary | ICD-10-CM | POA: Diagnosis not present

## 2018-05-16 DIAGNOSIS — Z791 Long term (current) use of non-steroidal anti-inflammatories (NSAID): Secondary | ICD-10-CM | POA: Diagnosis not present

## 2018-05-16 DIAGNOSIS — I10 Essential (primary) hypertension: Secondary | ICD-10-CM | POA: Diagnosis not present

## 2018-05-16 DIAGNOSIS — R5382 Chronic fatigue, unspecified: Secondary | ICD-10-CM | POA: Diagnosis not present

## 2018-05-16 DIAGNOSIS — Z9081 Acquired absence of spleen: Secondary | ICD-10-CM

## 2018-05-16 DIAGNOSIS — D45 Polycythemia vera: Secondary | ICD-10-CM

## 2018-05-16 DIAGNOSIS — D473 Essential (hemorrhagic) thrombocythemia: Secondary | ICD-10-CM

## 2018-05-16 DIAGNOSIS — Z8673 Personal history of transient ischemic attack (TIA), and cerebral infarction without residual deficits: Secondary | ICD-10-CM | POA: Insufficient documentation

## 2018-05-16 DIAGNOSIS — Z85828 Personal history of other malignant neoplasm of skin: Secondary | ICD-10-CM | POA: Diagnosis not present

## 2018-05-16 DIAGNOSIS — Z7982 Long term (current) use of aspirin: Secondary | ICD-10-CM | POA: Insufficient documentation

## 2018-05-16 DIAGNOSIS — E785 Hyperlipidemia, unspecified: Secondary | ICD-10-CM | POA: Diagnosis not present

## 2018-05-16 DIAGNOSIS — D508 Other iron deficiency anemias: Secondary | ICD-10-CM

## 2018-05-16 DIAGNOSIS — Z5111 Encounter for antineoplastic chemotherapy: Secondary | ICD-10-CM

## 2018-05-16 DIAGNOSIS — D72829 Elevated white blood cell count, unspecified: Secondary | ICD-10-CM

## 2018-05-16 DIAGNOSIS — D75839 Thrombocytosis, unspecified: Secondary | ICD-10-CM

## 2018-05-16 LAB — COMPREHENSIVE METABOLIC PANEL
ALT: 16 U/L (ref 0–44)
AST: 28 U/L (ref 15–41)
Albumin: 3.8 g/dL (ref 3.5–5.0)
Alkaline Phosphatase: 102 U/L (ref 38–126)
Anion gap: 7 (ref 5–15)
BUN: 13 mg/dL (ref 6–20)
CO2: 26 mmol/L (ref 22–32)
Calcium: 8.5 mg/dL — ABNORMAL LOW (ref 8.9–10.3)
Chloride: 107 mmol/L (ref 98–111)
Creatinine, Ser: 0.96 mg/dL (ref 0.61–1.24)
GFR calc Af Amer: >60 mL/min (ref >60)
GFR calc non Af Amer: >60 mL/min (ref >60)
Glucose, Bld: 92 mg/dL (ref 70–99)
Potassium: 3.8 mmol/L (ref 3.5–5.1)
Sodium: 140 mmol/L (ref 135–145)
Total Bilirubin: 1.1 mg/dL (ref 0.3–1.2)
Total Protein: 7.6 g/dL (ref 6.5–8.1)

## 2018-05-16 LAB — CBC WITH DIFFERENTIAL/PLATELET
Abs Immature Granulocytes: 0.12 10*3/uL — ABNORMAL HIGH (ref 0.00–0.07)
Basophils Absolute: 0.3 10*3/uL — ABNORMAL HIGH (ref 0.0–0.1)
Basophils Relative: 2 %
Eosinophils Absolute: 0.3 10*3/uL (ref 0.0–0.5)
Eosinophils Relative: 1 %
HCT: 47.3 % (ref 39.0–52.0)
Hemoglobin: 14.5 g/dL (ref 13.0–17.0)
Immature Granulocytes: 1 %
Lymphocytes Relative: 10 %
Lymphs Abs: 1.9 10*3/uL (ref 0.7–4.0)
MCH: 24.3 pg — ABNORMAL LOW (ref 26.0–34.0)
MCHC: 30.7 g/dL (ref 30.0–36.0)
MCV: 79.4 fL — ABNORMAL LOW (ref 80.0–100.0)
Monocytes Absolute: 1.5 10*3/uL — ABNORMAL HIGH (ref 0.1–1.0)
Monocytes Relative: 7 %
Neutro Abs: 15.8 10*3/uL — ABNORMAL HIGH (ref 1.7–7.7)
Neutrophils Relative %: 79 %
Platelets: 736 10*3/uL — ABNORMAL HIGH (ref 150–400)
RBC: 5.96 MIL/uL — ABNORMAL HIGH (ref 4.22–5.81)
RDW: 21.2 % — ABNORMAL HIGH (ref 11.5–15.5)
WBC: 19.8 10*3/uL — ABNORMAL HIGH (ref 4.0–10.5)
nRBC: 0.2 % (ref 0.0–0.2)

## 2018-05-17 ENCOUNTER — Other Ambulatory Visit: Payer: Medicare Other

## 2018-05-17 ENCOUNTER — Inpatient Hospital Stay: Payer: Medicare Other | Attending: Oncology | Admitting: Oncology

## 2018-05-17 ENCOUNTER — Telehealth: Payer: Self-pay

## 2018-05-17 ENCOUNTER — Encounter: Payer: Self-pay | Admitting: Oncology

## 2018-05-17 DIAGNOSIS — D72829 Elevated white blood cell count, unspecified: Secondary | ICD-10-CM | POA: Diagnosis not present

## 2018-05-17 DIAGNOSIS — D509 Iron deficiency anemia, unspecified: Secondary | ICD-10-CM | POA: Diagnosis not present

## 2018-05-17 DIAGNOSIS — D473 Essential (hemorrhagic) thrombocythemia: Secondary | ICD-10-CM | POA: Diagnosis not present

## 2018-05-17 DIAGNOSIS — D45 Polycythemia vera: Secondary | ICD-10-CM | POA: Diagnosis not present

## 2018-05-17 DIAGNOSIS — D75839 Thrombocytosis, unspecified: Secondary | ICD-10-CM

## 2018-05-17 MED ORDER — FERROUS SULFATE 325 (65 FE) MG PO TBEC: 325.0000 mg | DELAYED_RELEASE_TABLET | Freq: Every day | ORAL | 0 refills | Status: DC
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
## 2018-05-17 NOTE — Progress Notes (Signed)
Patient contacted via phone to start virtual visit.

## 2018-05-17 NOTE — Telephone Encounter (Signed)
CT bone marrow biopsy scheduled for 5/4 _0 . Called him to notify him about  appointment details and to be expecting a dox appt with Dr. Tasia Catchings, 10 days after biopsy. Pt voiced understanding.

## 2018-05-19 ENCOUNTER — Other Ambulatory Visit: Payer: Self-pay | Admitting: Radiology

## 2018-05-19 HISTORY — PX: BONE MARROW BIOPSY: SHX199

## 2018-05-22 ENCOUNTER — Ambulatory Visit
Admission: RE | Admit: 2018-05-22 | Discharge: 2018-05-22 | Disposition: A | Discharge status: Home / Self Care | Payer: Medicare Other | Source: Ambulatory Visit | Attending: Oncology | Admitting: Oncology

## 2018-05-22 ENCOUNTER — Other Ambulatory Visit: Payer: Self-pay

## 2018-05-22 ENCOUNTER — Other Ambulatory Visit (HOSPITAL_COMMUNITY)
Admission: RE | Admit: 2018-05-22 | Disposition: A | Discharge status: Home / Self Care | Payer: Medicare Other | Source: Ambulatory Visit | Attending: Oncology | Admitting: Oncology

## 2018-05-22 DIAGNOSIS — C946 Myelodysplastic disease, not classified: Secondary | ICD-10-CM | POA: Insufficient documentation

## 2018-05-22 DIAGNOSIS — D45 Polycythemia vera: Secondary | ICD-10-CM | POA: Diagnosis present

## 2018-05-22 DIAGNOSIS — Z8249 Family history of ischemic heart disease and other diseases of the circulatory system: Secondary | ICD-10-CM | POA: Insufficient documentation

## 2018-05-22 DIAGNOSIS — E785 Hyperlipidemia, unspecified: Secondary | ICD-10-CM | POA: Insufficient documentation

## 2018-05-22 DIAGNOSIS — D509 Iron deficiency anemia, unspecified: Secondary | ICD-10-CM | POA: Insufficient documentation

## 2018-05-22 DIAGNOSIS — Z8673 Personal history of transient ischemic attack (TIA), and cerebral infarction without residual deficits: Secondary | ICD-10-CM | POA: Insufficient documentation

## 2018-05-22 DIAGNOSIS — I10 Essential (primary) hypertension: Secondary | ICD-10-CM | POA: Diagnosis not present

## 2018-05-22 DIAGNOSIS — Z7982 Long term (current) use of aspirin: Secondary | ICD-10-CM | POA: Diagnosis not present

## 2018-05-22 DIAGNOSIS — Z7901 Long term (current) use of anticoagulants: Secondary | ICD-10-CM | POA: Insufficient documentation

## 2018-05-22 DIAGNOSIS — D473 Essential (hemorrhagic) thrombocythemia: Secondary | ICD-10-CM | POA: Diagnosis not present

## 2018-05-22 DIAGNOSIS — Z79899 Other long term (current) drug therapy: Secondary | ICD-10-CM | POA: Insufficient documentation

## 2018-05-22 LAB — CBC WITH DIFFERENTIAL/PLATELET
Abs Immature Granulocytes: 0.15 10*3/uL — ABNORMAL HIGH (ref 0.00–0.07)
Basophils Absolute: 0.3 10*3/uL — ABNORMAL HIGH (ref 0.0–0.1)
Basophils Relative: 2 %
Eosinophils Absolute: 0.3 10*3/uL (ref 0.0–0.5)
Eosinophils Relative: 2 %
HCT: 48.8 % (ref 39.0–52.0)
Hemoglobin: 14.8 g/dL (ref 13.0–17.0)
Immature Granulocytes: 1 %
Lymphocytes Relative: 11 %
Lymphs Abs: 1.9 10*3/uL (ref 0.7–4.0)
MCH: 24.3 pg — ABNORMAL LOW (ref 26.0–34.0)
MCHC: 30.3 g/dL (ref 30.0–36.0)
MCV: 80.1 fL (ref 80.0–100.0)
Monocytes Absolute: 1.4 10*3/uL — ABNORMAL HIGH (ref 0.1–1.0)
Monocytes Relative: 8 %
Neutro Abs: 13.2 10*3/uL — ABNORMAL HIGH (ref 1.7–7.7)
Neutrophils Relative %: 76 %
Platelets: 718 10*3/uL — ABNORMAL HIGH (ref 150–400)
RBC: 6.09 MIL/uL — ABNORMAL HIGH (ref 4.22–5.81)
RDW: 21.2 % — ABNORMAL HIGH (ref 11.5–15.5)
Smear Review: NORMAL
WBC: 17.3 10*3/uL — ABNORMAL HIGH (ref 4.0–10.5)
nRBC: 0 % (ref 0.0–0.2)

## 2018-05-22 MED ORDER — SODIUM CHLORIDE 0.9 % IV SOLN
INTRAVENOUS | Status: DC
  Administered 2018-05-22: 08:00:00 via INTRAVENOUS

## 2018-05-22 MED ORDER — FENTANYL CITRATE (PF) 100 MCG/2ML IJ SOLN
INTRAMUSCULAR | Status: AC
  Filled 2018-05-22: qty 4

## 2018-05-22 MED ORDER — MIDAZOLAM HCL 5 MG/5ML IJ SOLN
INTRAMUSCULAR | Status: AC | PRN
  Administered 2018-05-22: 1 mg via INTRAVENOUS
  Administered 2018-05-22 (×4): 0.5 mg via INTRAVENOUS
  Administered 2018-05-22: 1 mg via INTRAVENOUS

## 2018-05-22 MED ORDER — HEPARIN SOD (PORK) LOCK FLUSH 100 UNIT/ML IV SOLN
INTRAVENOUS | Status: AC
  Filled 2018-05-22: qty 5

## 2018-05-22 MED ORDER — MIDAZOLAM HCL 5 MG/5ML IJ SOLN
INTRAMUSCULAR | Status: AC
  Filled 2018-05-22: qty 5

## 2018-05-22 MED ORDER — FENTANYL CITRATE (PF) 100 MCG/2ML IJ SOLN
INTRAMUSCULAR | Status: AC | PRN
  Administered 2018-05-22 (×2): 25 ug via INTRAVENOUS
  Administered 2018-05-22: 50 ug via INTRAVENOUS
  Administered 2018-05-22: 25 ug via INTRAVENOUS

## 2018-05-22 NOTE — Progress Notes (Signed)
Patient stated he had traveled to [place], [place] and [place] 1 week ago to do a delivery Denies getting out of truck/having contact with others.  Dr. Kathlene Cote notified.  Covid-19 command center called.  Patient is asymptomatic and denies contact with positive contacts.  Per Command Center-no testing or rescheduling needed.  Dr. Kathlene Cote agrees with precaution of patient masking as well as staff.
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
## 2018-05-22 NOTE — Procedures (Signed)
Interventional Radiology Procedure Note  Procedure: CT guided bone marrow aspiration and biopsy  Complications: None  EBL: < 10 mL  Findings: Aspirate and core biopsy performed of bone marrow in left iliac bone.  Plan: Bedrest supine x 1 hrs  Shaunessy Dobratz T. Ashden Sonnenberg, M.D Pager:  [phone]    

## 2018-05-29 ENCOUNTER — Encounter (HOSPITAL_COMMUNITY): Payer: Self-pay | Admitting: Oncology

## 2018-05-31 ENCOUNTER — Encounter: Payer: Self-pay | Admitting: Oncology

## 2018-05-31 ENCOUNTER — Inpatient Hospital Stay: Payer: Medicare Other | Attending: Oncology | Admitting: Oncology

## 2018-05-31 ENCOUNTER — Other Ambulatory Visit: Payer: Self-pay

## 2018-05-31 DIAGNOSIS — E611 Iron deficiency: Secondary | ICD-10-CM

## 2018-05-31 DIAGNOSIS — D45 Polycythemia vera: Secondary | ICD-10-CM | POA: Diagnosis not present

## 2018-05-31 DIAGNOSIS — Z129 Encounter for screening for malignant neoplasm, site unspecified: Secondary | ICD-10-CM

## 2018-05-31 NOTE — Progress Notes (Signed)
Patient contacted for telehealth visit. No concerns voiced.  
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
## 2018-08-01 ENCOUNTER — Inpatient Hospital Stay: Payer: Medicare Other

## 2018-08-01 ENCOUNTER — Inpatient Hospital Stay: Payer: Medicare Other | Admitting: Oncology

## 2018-08-09 ENCOUNTER — Other Ambulatory Visit: Payer: Self-pay

## 2018-08-09 DIAGNOSIS — D45 Polycythemia vera: Secondary | ICD-10-CM

## 2018-08-09 DIAGNOSIS — E611 Iron deficiency: Secondary | ICD-10-CM

## 2018-08-10 ENCOUNTER — Encounter: Payer: Self-pay | Admitting: *Deleted

## 2018-08-10 ENCOUNTER — Other Ambulatory Visit: Payer: Self-pay

## 2018-08-10 ENCOUNTER — Inpatient Hospital Stay: Payer: Medicare Other | Admitting: Oncology

## 2018-08-10 ENCOUNTER — Inpatient Hospital Stay: Payer: Medicare Other | Attending: Oncology

## 2018-08-10 ENCOUNTER — Telehealth: Payer: Self-pay | Admitting: Gastroenterology

## 2018-08-10 MED ORDER — NA SULFATE-K SULFATE-MG SULF 17.5-3.13-1.6 GM/177ML PO SOLN: 1.0000 | Freq: Once | ORAL | 0 refills | Status: AC

## 2018-08-10 NOTE — Telephone Encounter (Signed)
Gastroenterology Pre-Procedure Review  Request Date: Mon 08/21/2018    [place] Requesting Physician: Dr. Bonna Gains  PATIENT REVIEW QUESTIONS: The patient responded to the following health history questions as indicated:    1. Are you having any GI issues? no 2. Do you have a personal history of Polyps? no 3. Do you have a family history of Colon Cancer or Polyps? yes (Father w/ polyps) 4. Diabetes Mellitus? no 5. Joint replacements in the past 12 months?no 6. Major health problems in the past 3 months?no 7. Any artificial heart valves, MVP, or defibrillator?no    MEDICATIONS & ALLERGIES:    Patient reports the following regarding taking any anticoagulation/antiplatelet therapy:   Plavix, Coumadin, Eliquis, Xarelto, Lovenox, Pradaxa, Brilinta, or Effient? no Aspirin? yes (81 mg)  Patient confirms/reports the following medications:  Current Outpatient Medications  Medication Sig Dispense Refill  . aspirin EC 81 MG tablet Take 81 mg by mouth daily.    Marland Kitchen atorvastatin (LIPITOR) 20 MG tablet Take 1 tablet (20 mg total) by mouth daily. (Patient taking differently: Take 20 mg by mouth daily at 6 PM. ) 90 tablet 0  . co-enzyme Q-10 30 MG capsule Take 30 mg by mouth daily.    . ferrous sulfate 325 (65 FE) MG EC tablet Take 1 tablet (325 mg total) by mouth daily. 14 tablet 0  . hydroxyurea (HYDREA) 500 MG capsule May take with food to minimize GI side effects. Take '500mg'$  daily on M,W,T,weekend, take '1000mg'$  on Tuesday and Fridays. 114 capsule 0  . Ibuprofen-Diphenhydramine HCl (ADVIL PM) 200-25 MG CAPS Take 1 tablet by mouth at bedtime as needed (pain, sleep).    . losartan (COZAAR) 100 MG tablet Take 100 mg by mouth every morning.  5  . minoxidil (LONITEN) 10 MG tablet Take 10 mg by mouth daily.     . Multiple Vitamins-Minerals (MULTIVITAMIN & MINERAL PO) Take 1 tablet by mouth daily.    . Na Sulfate-K Sulfate-Mg Sulf 17.5-3.13-1.6 GM/177ML SOLN Take 1 kit by mouth once for 1 dose. 354 mL 0  .  Probiotic Product (DAILY PROBIOTIC PO) Take 1 tablet by mouth daily.    Marland Kitchen terbinafine (LAMISIL AT JOCK ITCH) 1 % cream Lamisil AT 1 % topical cream  Apply 1 application every day by topical route.    . Vitamin D, Ergocalciferol, (DRISDOL) 50000 units CAPS capsule Take 50,000 Units by mouth every Monday.   3   No current facility-administered medications for this visit.     Patient confirms/reports the following allergies:  Allergies  Allergen Reactions  . Cymbalta [Duloxetine Hcl] Other (See Comments)    Suicidal thoughts    No orders of the defined types were placed in this encounter.   AUTHORIZATION INFORMATION Primary Insurance: 1D#: Group #:  Secondary Insurance: 1D#: Group #:  SCHEDULE INFORMATION: Date:  Time: Location:

## 2018-08-14 ENCOUNTER — Other Ambulatory Visit: Payer: Self-pay

## 2018-08-14 DIAGNOSIS — Z1211 Encounter for screening for malignant neoplasm of colon: Secondary | ICD-10-CM

## 2018-08-17 ENCOUNTER — Other Ambulatory Visit
Admission: RE | Admit: 2018-08-17 | Discharge: 2018-08-17 | Disposition: A | Discharge status: Home / Self Care | Payer: Medicare Other | Source: Ambulatory Visit | Attending: Gastroenterology | Admitting: Gastroenterology

## 2018-08-17 ENCOUNTER — Other Ambulatory Visit: Payer: Self-pay

## 2018-08-17 DIAGNOSIS — Z20828 Contact with and (suspected) exposure to other viral communicable diseases: Secondary | ICD-10-CM | POA: Diagnosis present

## 2018-08-17 NOTE — Discharge Instructions (Signed)

## 2018-08-18 LAB — SARS CORONAVIRUS 2 (TAT 6-24 HRS): SARS Coronavirus 2: NEGATIVE

## 2018-08-21 ENCOUNTER — Ambulatory Visit
Admission: RE | Admit: 2018-08-21 | Discharge: 2018-08-21 | Disposition: A | Discharge status: Home / Self Care | Payer: Medicare Other | Attending: Gastroenterology | Admitting: Gastroenterology

## 2018-08-21 ENCOUNTER — Other Ambulatory Visit: Payer: Self-pay

## 2018-08-21 ENCOUNTER — Ambulatory Visit: Payer: Medicare Other | Admitting: Anesthesiology

## 2018-08-21 ENCOUNTER — Encounter
Admission: RE | Disposition: A | Discharge status: Home / Self Care | Payer: Self-pay | Source: Home / Self Care | Attending: Gastroenterology

## 2018-08-21 DIAGNOSIS — Z8249 Family history of ischemic heart disease and other diseases of the circulatory system: Secondary | ICD-10-CM | POA: Insufficient documentation

## 2018-08-21 DIAGNOSIS — Z85828 Personal history of other malignant neoplasm of skin: Secondary | ICD-10-CM | POA: Diagnosis not present

## 2018-08-21 DIAGNOSIS — K648 Other hemorrhoids: Secondary | ICD-10-CM | POA: Diagnosis not present

## 2018-08-21 DIAGNOSIS — K5730 Diverticulosis of large intestine without perforation or abscess without bleeding: Secondary | ICD-10-CM

## 2018-08-21 DIAGNOSIS — E785 Hyperlipidemia, unspecified: Secondary | ICD-10-CM | POA: Diagnosis not present

## 2018-08-21 DIAGNOSIS — Z1211 Encounter for screening for malignant neoplasm of colon: Secondary | ICD-10-CM | POA: Insufficient documentation

## 2018-08-21 DIAGNOSIS — Z79899 Other long term (current) drug therapy: Secondary | ICD-10-CM | POA: Diagnosis not present

## 2018-08-21 DIAGNOSIS — I10 Essential (primary) hypertension: Secondary | ICD-10-CM | POA: Insufficient documentation

## 2018-08-21 DIAGNOSIS — Z8673 Personal history of transient ischemic attack (TIA), and cerebral infarction without residual deficits: Secondary | ICD-10-CM | POA: Insufficient documentation

## 2018-08-21 DIAGNOSIS — K6289 Other specified diseases of anus and rectum: Secondary | ICD-10-CM

## 2018-08-21 DIAGNOSIS — Z7982 Long term (current) use of aspirin: Secondary | ICD-10-CM | POA: Insufficient documentation

## 2018-08-21 DIAGNOSIS — K529 Noninfective gastroenteritis and colitis, unspecified: Secondary | ICD-10-CM | POA: Diagnosis not present

## 2018-08-21 HISTORY — PX: COLONOSCOPY WITH PROPOFOL: SHX5780

## 2018-08-21 SURGERY — COLONOSCOPY WITH PROPOFOL
Anesthesia: General | Site: Rectum

## 2018-08-21 MED ORDER — PROPOFOL 10 MG/ML IV BOLUS
INTRAVENOUS | Status: DC | PRN
  Administered 2018-08-21: 20 mg via INTRAVENOUS
  Administered 2018-08-21: 40 mg via INTRAVENOUS
  Administered 2018-08-21: 30 mg via INTRAVENOUS
  Administered 2018-08-21: 10 mg via INTRAVENOUS
  Administered 2018-08-21: 30 mg via INTRAVENOUS
  Administered 2018-08-21 (×2): 20 mg via INTRAVENOUS
  Administered 2018-08-21: 80 mg via INTRAVENOUS
  Administered 2018-08-21: 30 mg via INTRAVENOUS
  Administered 2018-08-21 (×3): 20 mg via INTRAVENOUS

## 2018-08-21 MED ORDER — LACTATED RINGERS IV SOLN
INTRAVENOUS | Status: DC
  Administered 2018-08-21: 09:00:00 via INTRAVENOUS

## 2018-08-21 MED ORDER — LIDOCAINE HCL (CARDIAC) PF 100 MG/5ML IV SOSY
PREFILLED_SYRINGE | INTRAVENOUS | Status: DC | PRN
  Administered 2018-08-21: 40 mg via INTRAVENOUS

## 2018-08-21 SURGICAL SUPPLY — 24 items
CANISTER SUCT 1200ML W/VALVE (MISCELLANEOUS) ×3 IMPLANT
CLIP HMST 235XBRD CATH ROT (MISCELLANEOUS) IMPLANT
CLIP RESOLUTION 360 11X235 (MISCELLANEOUS)
ELECT REM PT RETURN 9FT ADLT (ELECTROSURGICAL)
ELECTRODE REM PT RTRN 9FT ADLT (ELECTROSURGICAL) IMPLANT
FCP ESCP3.2XJMB 240X2.8X (MISCELLANEOUS)
FORCEPS BIOP RAD 4 LRG CAP 4 (CUTTING FORCEPS) ×2 IMPLANT
FORCEPS BIOP RJ4 240 W/NDL (MISCELLANEOUS)
FORCEPS ESCP3.2XJMB 240X2.8X (MISCELLANEOUS) IMPLANT
GOWN CVR UNV OPN BCK APRN NK (MISCELLANEOUS) ×2 IMPLANT
GOWN ISOL THUMB LOOP REG UNIV (MISCELLANEOUS) ×4
INJECTOR VARIJECT VIN23 (MISCELLANEOUS) IMPLANT
KIT DEFENDO VALVE AND CONN (KITS) IMPLANT
KIT ENDO PROCEDURE OLY (KITS) ×3 IMPLANT
MARKER SPOT ENDO TATTOO 5ML (MISCELLANEOUS) IMPLANT
PROBE APC STR FIRE (PROBE) IMPLANT
RETRIEVER NET ROTH 2.5X230 LF (MISCELLANEOUS) IMPLANT
SNARE SHORT THROW 13M SML OVAL (MISCELLANEOUS) IMPLANT
SNARE SHORT THROW 30M LRG OVAL (MISCELLANEOUS) IMPLANT
SNARE SNG USE RND 15MM (INSTRUMENTS) IMPLANT
SPOT EX ENDOSCOPIC TATTOO (MISCELLANEOUS)
TRAP ETRAP POLY (MISCELLANEOUS) IMPLANT
VARIJECT INJECTOR VIN23 (MISCELLANEOUS)
WATER STERILE IRR 250ML POUR (IV SOLUTION) ×3 IMPLANT

## 2018-08-21 NOTE — Anesthesia Procedure Notes (Signed)
Performed by: Maryetta Shafer, CRNA Pre-anesthesia Checklist: Patient identified, Emergency Drugs available, Suction available, Timeout performed and Patient being monitored Patient Re-evaluated:Patient Re-evaluated prior to induction Oxygen Delivery Method: Nasal cannula Placement Confirmation: positive ETCO2       

## 2018-08-21 NOTE — Anesthesia Postprocedure Evaluation (Signed)
Anesthesia Post Note  Patient: Kenneth [name]  Procedure(s) Performed: COLONOSCOPY WITH PROPOFOL (N/A Rectum)  Patient location during evaluation: PACU Anesthesia Type: General Level of consciousness: awake and alert Pain management: pain level controlled Vital Signs Assessment: post-procedure vital signs reviewed and stable Respiratory status: spontaneous breathing, nonlabored ventilation, respiratory function stable and patient connected to nasal cannula oxygen Cardiovascular status: blood pressure returned to baseline and stable Postop Assessment: no apparent nausea or vomiting Anesthetic complications: no    Adele Barthel Tehila Sokolow
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
## 2018-08-21 NOTE — Transfer of Care (Signed)
Immediate Anesthesia Transfer of Care Note  Patient: Kenneth [name]  Procedure(s) Performed: COLONOSCOPY WITH PROPOFOL (N/A Rectum)  Patient Location: PACU  Anesthesia Type: General  Level of Consciousness: awake, alert  and patient cooperative  Airway and Oxygen Therapy: Patient Spontanous Breathing and Patient connected to supplemental oxygen  Post-op Assessment: Post-op Vital signs reviewed, Patient's Cardiovascular Status Stable, Respiratory Function Stable, Patent Airway and No signs of Nausea or vomiting  Post-op Vital Signs: Reviewed and stable  Complications: No apparent anesthesia complications
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
## 2018-08-21 NOTE — Op Note (Signed)
[facility] Patient Name: Kenneth [name] Procedure Date: 08/21/2018 9:41 AM MRN: [id number] Account #: [id number] Date of Birth: [birth year] Admit Type: Outpatient Age: 58 Room: [facility] OR ROOM 01 Gender: Male Note Status: Finalized Procedure:            Colonoscopy Indications:          Screening for colorectal malignant neoplasm Providers:            Margaret Staggs B. Bonna Gains MD, MD Referring MD:         Raynelle Bring MD, MD (Referring MD) Medicines:            Monitored Anesthesia Care Complications:        No immediate complications. Procedure:            Pre-Anesthesia Assessment:                       - Prior to the procedure, a History and Physical was                        performed, and patient medications, allergies and                        sensitivities were reviewed. The patient's tolerance of                        previous anesthesia was reviewed.                       - The risks and benefits of the procedure and the                        sedation options and risks were discussed with the                        patient. All questions were answered and informed                        consent was obtained.                       - Patient identification and proposed procedure were                        verified prior to the procedure by the physician, the                        nurse, the anesthetist and the technician. The                        procedure was verified in the pre-procedure area in the                        procedure room in the endoscopy suite.                       - ASA Grade Assessment: II - A patient with mild                        systemic disease.                       -  After reviewing the risks and benefits, the patient                        was deemed in satisfactory condition to undergo the                        procedure.                       After obtaining informed consent, the colonoscope was               passed under direct vision. Throughout the procedure,                        the patient's blood pressure, pulse, and oxygen                        saturations were monitored continuously. The was                        introduced through the anus and advanced to the the                        cecum, identified by appendiceal orifice and ileocecal                        valve. The colonoscopy was performed with ease. The                        patient tolerated the procedure well. The quality of                        the bowel preparation was good. Findings:      The perianal and digital rectal examinations were normal.      A few diverticula were found in the sigmoid colon.      A patchy area of mildly erythematous mucosa was found in the rectum.       Biopsies were taken with a cold forceps for histology.      The exam was otherwise without abnormality.      The rectum, sigmoid colon, descending colon, transverse colon, ascending       colon and cecum appeared normal.      Non-bleeding internal hemorrhoids were found during retroflexion.      No additional abnormalities were found on retroflexion. Impression:           - Diverticulosis in the sigmoid colon.                       - Erythematous mucosa in the rectum. Biopsied.                       - The examination was otherwise normal.                       - The rectum, sigmoid colon, descending colon,                        transverse colon, ascending colon and cecum are normal.                       -  Non-bleeding internal hemorrhoids. Recommendation:       - Discharge patient to home.                       - Resume previous diet.                       - Continue present medications.                       - Repeat colonoscopy in 10 years for screening purposes.                       - Return to primary care physician as previously                        scheduled.                       - The findings and  recommendations were discussed with                        the patient.                       - The findings and recommendations were discussed with                        the patient's family.                       - High fiber diet.                       - In the future, if patient develops new symptoms such                        as blood per rectum, abdominal pain, weight loss,                        altered bowel habits or any other reason for concern,                        patient should discuss this with thier PCP as they may                        need a GI referral at that time or evaluation for need                        for colonoscopy earlier than the recommended screening                        colonoscopy.                       In addition, if patient's family history of colon                        cancer changes (no family history at this time) in the                        future, earlier screening may be indicated and patient  should discuss this with PCP as well.                       - Await pathology results. Procedure Code(s):    --- Professional ---                       [phone], Colonoscopy, flexible; with biopsy, single or                        multiple Diagnosis Code(s):    --- Professional ---                       Z12.11, Encounter for screening for malignant neoplasm                        of colon                       K64.8, Other hemorrhoids                       K62.89, Other specified diseases of anus and rectum                       K57.30, Diverticulosis of large intestine without                        perforation or abscess without bleeding CPT copyright 2019 American Medical Association. All rights reserved. The codes documented in this report are preliminary and upon coder review may  be revised to meet current compliance requirements.  Vonda Antigua, MD Margretta Sidle B. Bonna Gains MD, MD 08/21/2018 10:30:26 AM This report has  been signed electronically. Number of Addenda: 0 Note Initiated On: 08/21/2018 9:41 AM Scope Withdrawal Time: 0 hours 18 minutes 3 seconds  Total Procedure Duration: 0 hours 21 minutes 23 seconds  Estimated Blood Loss: Estimated blood loss: none.      [facility]

## 2018-08-22 ENCOUNTER — Encounter: Payer: Self-pay | Admitting: Gastroenterology

## 2018-08-24 ENCOUNTER — Encounter: Payer: Self-pay | Admitting: Gastroenterology
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
## 2020-08-07 IMAGING — CT CT BONE MARROW BIOPSY AND ASPIRATION
1 of 2 series · 10 of 14 positions shown, 13 images · non-contrast
Comparison: none

CLINICAL DATA: Polycythemia Polin, thrombocytosis, leukocytosis and
anemia.

[Series 2: i-spiral 5.0 b30f · axial · 0.74mm/px · z∈[+844,+970]mm · 10 of 45 slices shown, 13 images]
[im 5/45  soft-tissue]
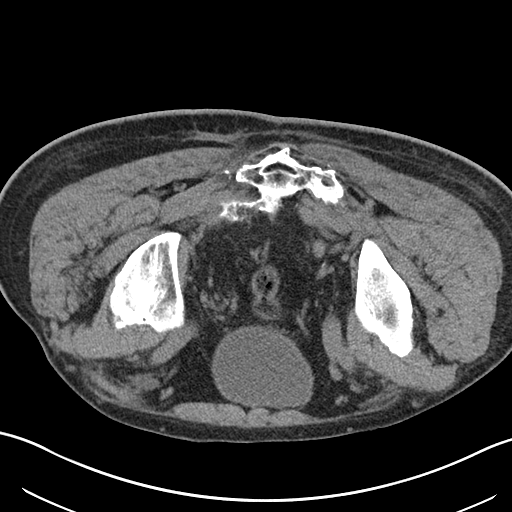
[im 5/45  bone]
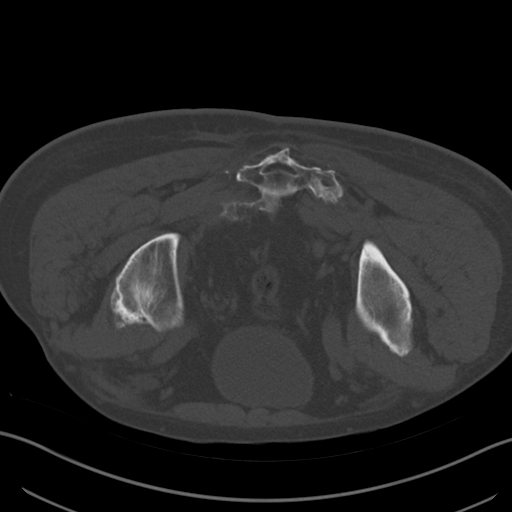
[im 9/45  bone]
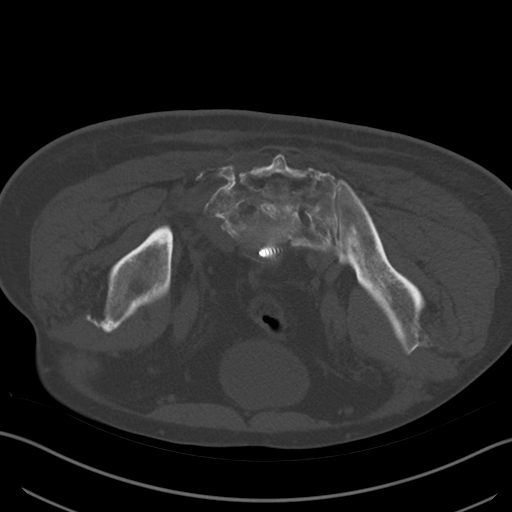
[im 13/45  bone]
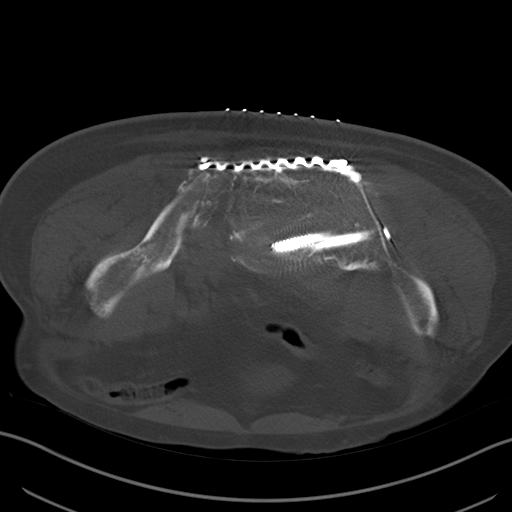
[im 17/45  bone]
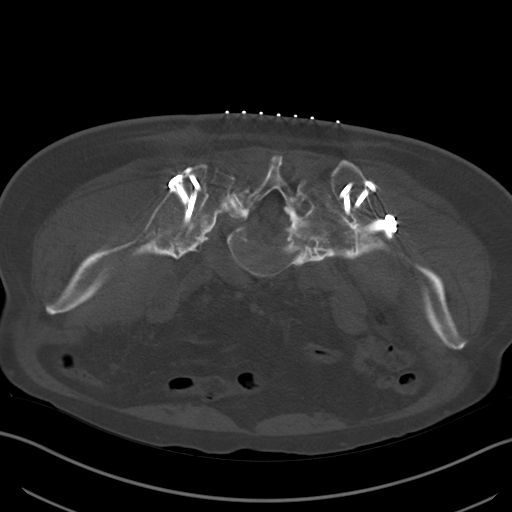
[im 21/45  soft-tissue]
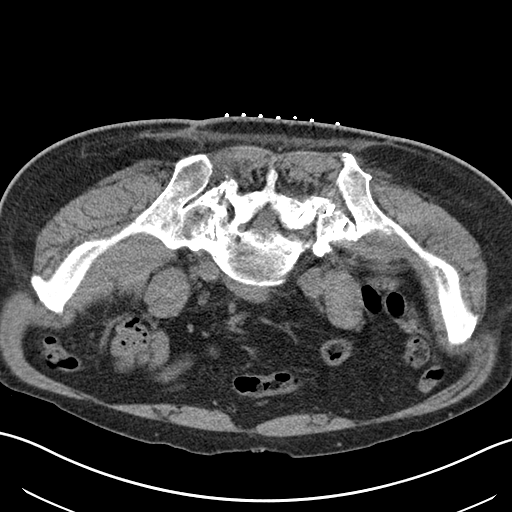
[im 21/45  bone]
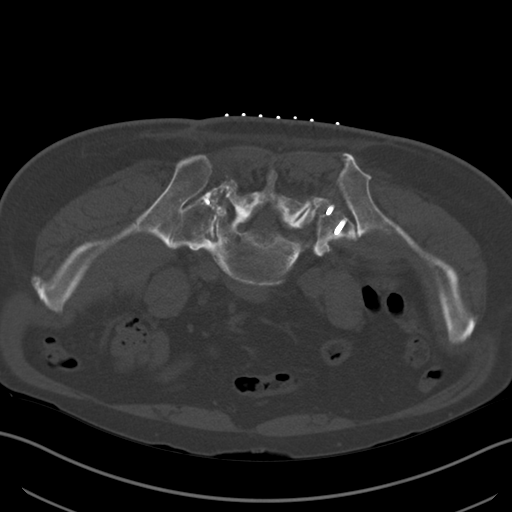
[im 25/45  bone]
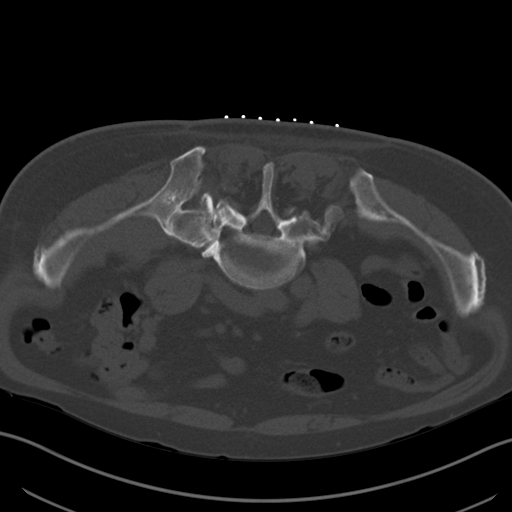
[im 29/45  bone]
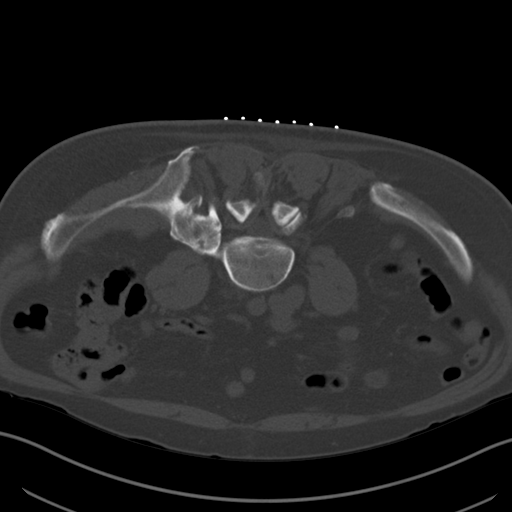
[im 33/45  bone]
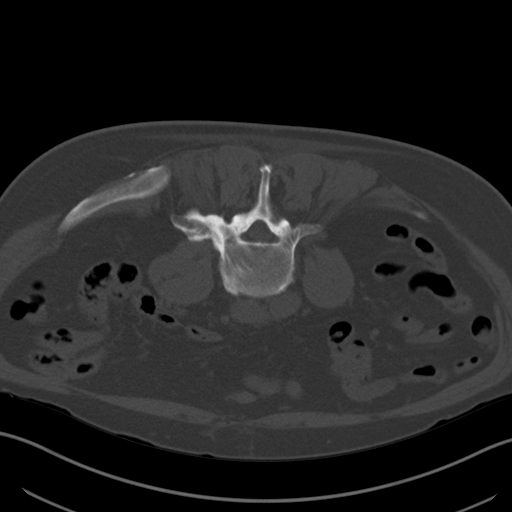
[im 37/45  soft-tissue]
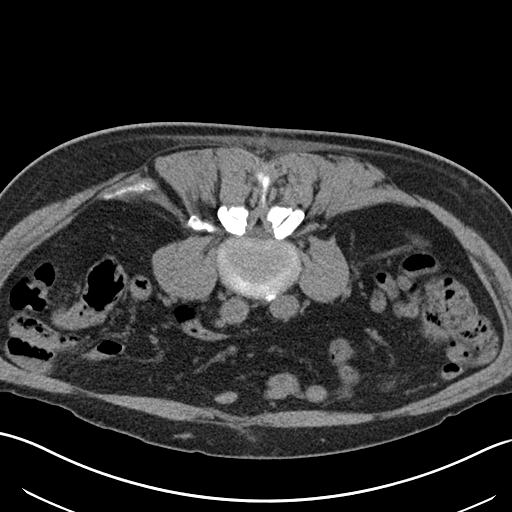
[im 37/45  bone]
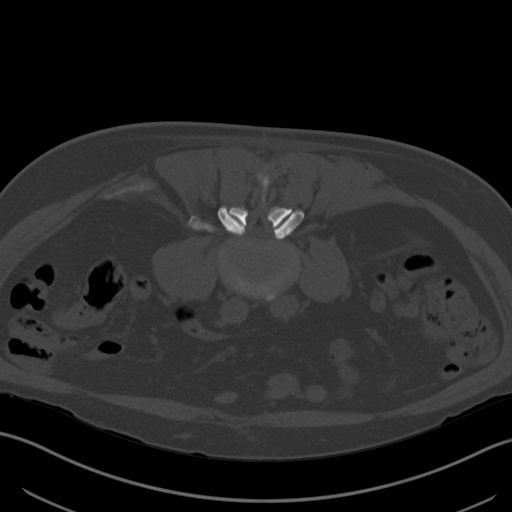
[im 41/45  bone]
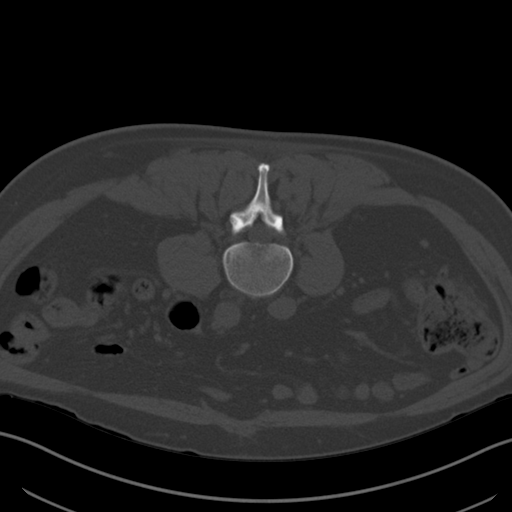

[10 of 14 positions shown; findings below may reference images not displayed]

EXAM:
CT GUIDED BONE MARROW ASPIRATION AND BIOPSY

ANESTHESIA/SEDATION:
Versed 4.0 mg IV, Fentanyl 125 mcg IV

Total Moderate Sedation Time:   19 minutes.

The patient's level of consciousness and physiologic status were
continuously monitored during the procedure by Radiology nursing.

PROCEDURE:
The procedure risks, benefits, and alternatives were explained to
the patient. Questions regarding the procedure were encouraged and
answered. The patient understands and consents to the procedure. A
time out was performed prior to initiating the procedure.

The left gluteal region was prepped with chlorhexidine. Sterile gown
and sterile gloves were used for the procedure. Local anesthesia was
provided with 1% Lidocaine.

Under CT guidance, an 11 gauge On Control bone cutting needle was
advanced from a posterior approach into the left iliac bone. Needle
positioning was confirmed with CT. Initial non heparinized and
heparinized aspirate samples were obtained of bone marrow. Core
biopsy was performed via the On Control drill needle.

COMPLICATIONS:
None
FINDINGS: Inspection of initial aspirate did reveal visible particles. Intact
core biopsy sample was obtained.
IMPRESSION: CT guided bone marrow biopsy of left posterior iliac bone with both
aspirate and core samples obtained.

## 2021-04-03 ENCOUNTER — Telehealth: Payer: Self-pay | Admitting: *Deleted

## 2021-04-03 DIAGNOSIS — D45 Polycythemia vera: Secondary | ICD-10-CM

## 2021-04-03 NOTE — Telephone Encounter (Signed)
Patient called scheduling line and left a detailed vm. I returned his phone call and discussed his care in length. ? ?[place] and would like to get back in with Dr. Tasia Catchings asap to manage the polycythemia vera. He is currently taking hydrea 500 mg daily and needs to reestablish so the hydrea can be prescribed. Pt highly anxious to get an apt asap.  I explained to him that Dr. Tasia Catchings does not have any openings next week. I offered to schedule the patient with one of the NPs. He accepted an apt with Rulon Abide, NP on 3/23 at 315pm. He will bring his labs from his pcp in [place] so that Sonia Baller can compare labs.  ? ? ?Pt wanted to know if the NP would be willing to prescribe any blood pressure medications. I explained to him that our clinic would be managing the hydrea and dx with polycythemia vera only. He has not reestablished with a new pcp in the area. He thanked me for setting these expectations and asked me to provide him with a list of [facility] pcps in the area that he could call for New patient apt. He also asked me to reset his mychart password with [facility].  Per his request, I temporarily set up the pass code as "[id number]." He will go back and change this as soon as he able to do so. ? ?He thanked me for calling him back. He knows that Sonia Baller will schedule his future apts with Dr. Tasia Catchings, but for now, he can get reestablished with hematology. ? ? ?

## 2021-04-09 ENCOUNTER — Other Ambulatory Visit: Payer: Self-pay

## 2021-04-09 ENCOUNTER — Inpatient Hospital Stay: Payer: Medicare Other

## 2021-04-09 ENCOUNTER — Telehealth: Payer: Self-pay

## 2021-04-09 ENCOUNTER — Inpatient Hospital Stay: Payer: Medicare Other | Attending: Oncology | Admitting: Nurse Practitioner

## 2021-04-09 VITALS — BP 134/99 | HR 81 | Temp 97.1°F | Resp 17 | Ht 73.0 in | Wt 185.0 lb

## 2021-04-09 DIAGNOSIS — I10 Essential (primary) hypertension: Secondary | ICD-10-CM | POA: Insufficient documentation

## 2021-04-09 DIAGNOSIS — D45 Polycythemia vera: Secondary | ICD-10-CM

## 2021-04-09 DIAGNOSIS — D75839 Thrombocytosis, unspecified: Secondary | ICD-10-CM | POA: Insufficient documentation

## 2021-04-09 DIAGNOSIS — L298 Other pruritus: Secondary | ICD-10-CM | POA: Diagnosis not present

## 2021-04-09 DIAGNOSIS — Z79899 Other long term (current) drug therapy: Secondary | ICD-10-CM

## 2021-04-09 DIAGNOSIS — E611 Iron deficiency: Secondary | ICD-10-CM | POA: Diagnosis not present

## 2021-04-09 DIAGNOSIS — Z808 Family history of malignant neoplasm of other organs or systems: Secondary | ICD-10-CM | POA: Insufficient documentation

## 2021-04-09 DIAGNOSIS — L299 Pruritus, unspecified: Secondary | ICD-10-CM | POA: Diagnosis not present

## 2021-04-09 LAB — COMPREHENSIVE METABOLIC PANEL
ALT: 16 U/L (ref 0–44)
AST: 20 U/L (ref 15–41)
Albumin: 4 g/dL (ref 3.5–5.0)
Alkaline Phosphatase: 93 U/L (ref 38–126)
Anion gap: 7 (ref 5–15)
BUN: 20 mg/dL (ref 6–20)
CO2: 25 mmol/L (ref 22–32)
Calcium: 8.5 mg/dL — ABNORMAL LOW (ref 8.9–10.3)
Chloride: 105 mmol/L (ref 98–111)
Creatinine, Ser: 0.75 mg/dL (ref 0.61–1.24)
GFR, Estimated: >60 mL/min (ref >60)
Glucose, Bld: 93 mg/dL (ref 70–99)
Potassium: 4.5 mmol/L (ref 3.5–5.1)
Sodium: 137 mmol/L (ref 135–145)
Total Bilirubin: 0.7 mg/dL (ref 0.3–1.2)
Total Protein: 7.6 g/dL (ref 6.5–8.1)

## 2021-04-09 LAB — CBC WITH DIFFERENTIAL/PLATELET
Abs Immature Granulocytes: 0.15 10*3/uL — ABNORMAL HIGH (ref 0.00–0.07)
Basophils Absolute: 0.5 10*3/uL — ABNORMAL HIGH (ref 0.0–0.1)
Basophils Relative: 2 %
Eosinophils Absolute: 0.3 10*3/uL (ref 0.0–0.5)
Eosinophils Relative: 1 %
HCT: 45 % (ref 39.0–52.0)
Hemoglobin: 14.1 g/dL (ref 13.0–17.0)
Immature Granulocytes: 1 %
Lymphocytes Relative: 10 %
Lymphs Abs: 2.3 10*3/uL (ref 0.7–4.0)
MCH: 24.4 pg — ABNORMAL LOW (ref 26.0–34.0)
MCHC: 31.3 g/dL (ref 30.0–36.0)
MCV: 78 fL — ABNORMAL LOW (ref 80.0–100.0)
Monocytes Absolute: 1.5 10*3/uL — ABNORMAL HIGH (ref 0.1–1.0)
Monocytes Relative: 6 %
Neutro Abs: 19 10*3/uL — ABNORMAL HIGH (ref 1.7–7.7)
Neutrophils Relative %: 80 %
Platelets: 806 10*3/uL — ABNORMAL HIGH (ref 150–400)
RBC: 5.77 MIL/uL (ref 4.22–5.81)
RDW: 21.2 % — ABNORMAL HIGH (ref 11.5–15.5)
WBC: 23.8 10*3/uL — ABNORMAL HIGH (ref 4.0–10.5)
nRBC: 0.1 % (ref 0.0–0.2)

## 2021-04-09 MED ORDER — FERROUS SULFATE 325 (65 FE) MG PO TBEC: 325.0000 mg | DELAYED_RELEASE_TABLET | Freq: Every day | ORAL | 0 refills | Status: DC

## 2021-04-09 MED ORDER — HYDROXYUREA 500 MG PO CAPS: ORAL_CAPSULE | ORAL | 0 refills | Status: DC

## 2021-04-09 MED ORDER — HYDROXYZINE HCL 10 MG PO TABS: 10.0000 mg | ORAL_TABLET | Freq: Three times a day (TID) | ORAL | 0 refills | Status: DC | PRN

## 2021-04-09 NOTE — Patient Instructions (Signed)
Please call the [facility] at [phone] to establish care with a Primary Care Provider.   In the meantime, you can access care through the following free and reduced cost health care locations in [place]:   -[facility] ([address], [place], [place] [address]- [phone])  -[facility] ([address], [address], [place] [address]- [phone])  -[facility] ([address], [place], [place] [address]- [phone])  -[facility] ([address], [place]- [phone])  -[facility] [place]- [phone])  

## 2021-04-09 NOTE — Progress Notes (Signed)
Patient here for oncology follow-up appointment, expresses concerns of itching is worse thinks the PV is worse, SOB, requests  PCP ?
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
## 2021-04-10 ENCOUNTER — Encounter: Payer: Self-pay | Admitting: Oncology

## 2021-04-10 NOTE — Telephone Encounter (Signed)
Letter mailed regarding upcoming appointment details with new PCP as requested by patient  ?

## 2021-04-16 ENCOUNTER — Other Ambulatory Visit: Payer: Self-pay | Admitting: Nurse Practitioner

## 2021-04-28 ENCOUNTER — Ambulatory Visit (INDEPENDENT_AMBULATORY_CARE_PROVIDER_SITE_OTHER): Payer: Medicare Other | Admitting: Physician Assistant

## 2021-04-28 ENCOUNTER — Encounter: Payer: Self-pay | Admitting: Physician Assistant

## 2021-04-28 VITALS — BP 127/97 | HR 81 | Ht 73.0 in | Wt 184.3 lb

## 2021-04-28 DIAGNOSIS — D45 Polycythemia vera: Secondary | ICD-10-CM

## 2021-04-28 DIAGNOSIS — M21372 Foot drop, left foot: Secondary | ICD-10-CM

## 2021-04-28 DIAGNOSIS — I10 Essential (primary) hypertension: Secondary | ICD-10-CM

## 2021-04-28 DIAGNOSIS — E611 Iron deficiency: Secondary | ICD-10-CM | POA: Insufficient documentation

## 2021-04-28 DIAGNOSIS — R7989 Other specified abnormal findings of blood chemistry: Secondary | ICD-10-CM

## 2021-04-28 DIAGNOSIS — E7800 Pure hypercholesterolemia, unspecified: Secondary | ICD-10-CM | POA: Diagnosis not present

## 2021-04-28 DIAGNOSIS — R269 Unspecified abnormalities of gait and mobility: Secondary | ICD-10-CM

## 2021-04-28 DIAGNOSIS — R7303 Prediabetes: Secondary | ICD-10-CM | POA: Insufficient documentation

## 2021-04-28 DIAGNOSIS — E559 Vitamin D deficiency, unspecified: Secondary | ICD-10-CM

## 2021-04-28 DIAGNOSIS — R351 Nocturia: Secondary | ICD-10-CM

## 2021-04-28 MED ORDER — HYDROXYUREA 500 MG PO CAPS: ORAL_CAPSULE | ORAL | 3 refills | Status: DC

## 2021-04-28 MED ORDER — MINOXIDIL 2.5 MG PO TABS: 5.0000 mg | ORAL_TABLET | Freq: Every day | ORAL | 1 refills | Status: DC

## 2021-04-28 MED ORDER — LOSARTAN POTASSIUM 50 MG PO TABS: 50.0000 mg | ORAL_TABLET | Freq: Every day | ORAL | 1 refills | Status: DC

## 2021-04-28 NOTE — Assessment & Plan Note (Signed)
Managed by [facility]. Pt needs a refill of hydroxyurea as his dose was increased ?Future refills should come from [facility] ?
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
## 2021-04-28 NOTE — Assessment & Plan Note (Signed)
Will check PSA 

## 2021-04-28 NOTE — Assessment & Plan Note (Signed)
Historically, oncologist was going to check but pt requested regular bloodwork as well ?

## 2021-04-28 NOTE — Assessment & Plan Note (Signed)
Historically. Takes 50,000 U weekly.  ?Will recheck vit d ?

## 2021-04-28 NOTE — Assessment & Plan Note (Signed)
Historically, used to take testosterone injections, but hasnt in a long time ?Will recheck, if low, inclined to send to endo d/t comorbidities ?

## 2021-04-28 NOTE — Assessment & Plan Note (Signed)
Managed with losartan 50 mg and Minoxidil 5 mg.  ?Sent in appropriate doses.  ?F/u 3-4 mo ?

## 2021-04-28 NOTE — Assessment & Plan Note (Signed)
Historically, but reported weight loss and diet changes ?Fasting glucose has been <100. Will check fasting glucose and A1c ?

## 2021-04-28 NOTE — Assessment & Plan Note (Signed)
Currently unmedicated, weight loss and change of diet ?Will reeval ?

## 2021-04-30 ENCOUNTER — Other Ambulatory Visit: Payer: Self-pay | Admitting: Physician Assistant

## 2021-04-30 DIAGNOSIS — D45 Polycythemia vera: Secondary | ICD-10-CM

## 2021-04-30 DIAGNOSIS — R7989 Other specified abnormal findings of blood chemistry: Secondary | ICD-10-CM

## 2021-04-30 LAB — TESTOSTERONE,FREE AND TOTAL
Testosterone, Free: 4.2 pg/mL — ABNORMAL LOW (ref 7.2–24.0)
Testosterone: 298 ng/dL (ref 264–916)

## 2021-04-30 LAB — PSA: Prostate Specific Ag, Serum: 0.9 ng/mL (ref 0.0–4.0)

## 2021-04-30 LAB — IRON,TIBC AND FERRITIN PANEL
Ferritin: 9 ng/mL — ABNORMAL LOW (ref 30–400)
Iron Saturation: 5 % — CL (ref 15–55)
Iron: 17 ug/dL — ABNORMAL LOW (ref 38–169)
Total Iron Binding Capacity: 357 ug/dL (ref 250–450)
UIBC: 340 ug/dL (ref 111–343)

## 2021-04-30 LAB — LIPID PANEL
Chol/HDL Ratio: 3.4 ratio (ref 0.0–5.0)
Cholesterol, Total: 184 mg/dL (ref 100–199)
HDL: 54 mg/dL (ref >39)
LDL Chol Calc (NIH): 119 mg/dL — ABNORMAL HIGH (ref 0–99)
Triglycerides: 57 mg/dL (ref 0–149)
VLDL Cholesterol Cal: 11 mg/dL (ref 5–40)

## 2021-04-30 LAB — VITAMIN D 25 HYDROXY (VIT D DEFICIENCY, FRACTURES): Vit D, 25-Hydroxy: 85.3 ng/mL (ref 30.0–100.0)

## 2021-04-30 LAB — HEMOGLOBIN A1C
Est. average glucose Bld gHb Est-mCnc: 100 mg/dL
Hgb A1c MFr Bld: 5.1 % (ref 4.8–5.6)

## 2021-04-30 NOTE — Progress Notes (Signed)
Ref end for low testosterone w/ polycythemia vera-- prefer them to direct treatment ?

## 2021-05-01 ENCOUNTER — Telehealth: Payer: Self-pay | Admitting: *Deleted

## 2021-05-01 NOTE — Telephone Encounter (Signed)
Copied from [place] [phone]. Topic: General - Other ?>> May 01, 2021  9:43 AM McGill, Nelva Bush wrote: ?Reason for CRM: Kieth Brightly from [facility] stated they have received a referral, however, office notes, labs, and an insurance card are needed. ? ?Fax- [phone] ?[phone] ?

## 2021-05-04 ENCOUNTER — Telehealth: Payer: Self-pay

## 2021-05-04 NOTE — Telephone Encounter (Signed)
Copied from [place] [phone]. Topic: Referral - Question ?>> May 04, 2021 10:15 AM Berneta Levins wrote: ?Reason for CRM: Jiles Garter from [facility] received referral but needs OV Notes, labs, and a copy of insurance card faxed to [phone]. ?

## 2021-05-12 ENCOUNTER — Inpatient Hospital Stay: Payer: Medicare Other | Attending: Oncology

## 2021-05-12 ENCOUNTER — Inpatient Hospital Stay: Payer: Medicare Other | Admitting: Oncology

## 2021-06-19 ENCOUNTER — Telehealth: Payer: Self-pay

## 2021-06-19 NOTE — Telephone Encounter (Signed)
Copied from [place] [phone]. Topic: General - Other >> Jun 19, 2021 11:16 AM Pawlus, Brayton Layman A wrote: Reason for CRM: Caller was following up on a "AFO' fax that was sent over, caller was needing the rest of the office notes from 04/28/21 faxed over, caller stated she only received part of the notes.

## 2021-07-13 ENCOUNTER — Telehealth: Payer: Self-pay | Admitting: Physician Assistant

## 2021-07-29 LAB — CBC AND DIFFERENTIAL
HCT: 43 (ref 41–53)
Hemoglobin: 14 (ref 13.5–17.5)
Platelets: 398 10*3/uL (ref 150–400)
WBC: 10.6

## 2021-07-29 LAB — CBC: RBC: 5.12 — AB (ref 3.87–5.11)

## 2021-07-29 LAB — HEPATIC FUNCTION PANEL
ALT: 12 U/L (ref 10–40)
AST: 17 (ref 14–40)
Alkaline Phosphatase: 84 (ref 25–125)
Bilirubin, Direct: 0.13 (ref 0.01–0.4)
Bilirubin, Total: 0.5

## 2021-07-29 LAB — TESTOSTERONE: Testosterone: 842

## 2021-08-06 ENCOUNTER — Telehealth: Payer: Self-pay | Admitting: Physician Assistant

## 2021-08-06 NOTE — Telephone Encounter (Signed)
Copied from [place] [phone]. Topic: Medicare AWV >> Aug 06, 2021  9:57 AM Jae Dire wrote: Reason for CRM:  Left message for patient to call back and schedule Medicare Annual Wellness Visit (AWV) in office.   If not able to come in the office, please offer to do virtually or by telephone.   No hx of AWV - AWV-I eligible per [employer] as of  07/19/2019  Please schedule at anytime with [facility] Health Advisor.   45 minute appointment  Any questions, please contact me at [phone]

## 2021-08-28 ENCOUNTER — Telehealth: Payer: Self-pay | Admitting: Physician Assistant

## 2021-08-28 NOTE — Telephone Encounter (Signed)
Copied from [place] [phone]. Topic: Medicare AWV >> Aug 28, 2021 11:12 AM Jae Dire wrote: Reason for CRM:  Left message for patient to call back and schedule Medicare Annual Wellness Visit (AWV) in office.   If not able to come in the office, please offer to do virtually or by telephone.   No hx of AWV - AWV-I eligible per [employer] as of  07/19/2019  Please schedule at anytime with [facility] Health Advisor.   45 minute appointment  Any questions, please contact me at [phone]

## 2021-09-02 ENCOUNTER — Ambulatory Visit: Payer: Medicare Other | Admitting: Physician Assistant

## 2021-09-09 NOTE — Progress Notes (Unsigned)
I,Sha'taria Tyson,acting as a Education administrator for [employer], PA-C.,have documented all relevant documentation on the behalf of [name], PA-C,as directed by  [name], PA-C while in the presence of [name], PA-C.   Established patient visit   Patient: Kenneth [name]   DOB: December 23, 1960   61 y.o. Male  MRN: [id number] Visit Date: 09/10/2021  Today's healthcare provider: [name], PA-C   No chief complaint on file.  Subjective    HPI  Lipid/Cholesterol, Follow-up  Last lipid panel Other pertinent labs  Lab Results  Component Value Date   CHOL 184 04/28/2021   HDL 54 04/28/2021   LDLCALC 119 (H) 04/28/2021   TRIG 57 04/28/2021   CHOLHDL 3.4 04/28/2021   Lab Results  Component Value Date   ALT 16 04/09/2021   AST 20 04/09/2021   PLT 806 (H) 04/09/2021   TSH 1.919 05/30/2017     He was last seen for this 4 months ago.  Management since that visit includes ***.  He reports {excellent/good/fair/poor:19665} compliance with treatment. He {is/is not:9024} having side effects. {document side effects if present:1}  Symptoms: {Yes/No:20286} chest pain {Yes/No:20286} chest pressure/discomfort  {Yes/No:20286} dyspnea {Yes/No:20286} lower extremity edema  {Yes/No:20286} numbness or tingling of extremity {Yes/No:20286} orthopnea  {Yes/No:20286} palpitations {Yes/No:20286} paroxysmal nocturnal dyspnea  {Yes/No:20286} speech difficulty {Yes/No:20286} syncope   Current diet: {diet habits:16563} Current exercise: {exercise types:16438}  The 10-year ASCVD risk score (Arnett DK, et al., 2019) is: 7.3%  --------------------------------------------------------------------------------------------------- Hypertension, follow-up  BP Readings from Last 3 Encounters:  04/28/21 (!) 127/97  04/09/21 (!) 134/99  08/21/18 (!) 129/100   Wt Readings from Last 3 Encounters:  04/28/21 184 lb 4.8 oz (83.6 kg)  04/09/21 185 lb (83.9 kg)  08/21/18 227 lb (103 kg)      He was last seen for hypertension 4 months ago.  BP at that visit was ***. Management since that visit includes ***.  He reports {excellent/good/fair/poor:19665} compliance with treatment. He {is/is not:9024} having side effects. {document side effects if present:1} He is following a {diet:21022986} diet. He {is/is not:9024} exercising. He {does/does not:200015} smoke.  Use of agents associated with hypertension: {bp agents assoc with hypertension:511::"none"}.   Outside blood pressures are {***enter patient reported home BP readings, or 'not being checked':1}. Symptoms: {Yes/No:20286} chest pain {Yes/No:20286} chest pressure  {Yes/No:20286} palpitations {Yes/No:20286} syncope  {Yes/No:20286} dyspnea {Yes/No:20286} orthopnea  {Yes/No:20286} paroxysmal nocturnal dyspnea {Yes/No:20286} lower extremity edema   Pertinent labs Lab Results  Component Value Date   CHOL 184 04/28/2021   HDL 54 04/28/2021   LDLCALC 119 (H) 04/28/2021   TRIG 57 04/28/2021   CHOLHDL 3.4 04/28/2021   Lab Results  Component Value Date   NA 137 04/09/2021   K 4.5 04/09/2021   CREATININE 0.75 04/09/2021   GFRNONAA >60 04/09/2021   GLUCOSE 93 04/09/2021   TSH 1.919 05/30/2017     The 10-year ASCVD risk score (Arnett DK, et al., 2019) is: 7.3%  ---------------------------------------------------------------------------------------------------   Medications: Outpatient Medications Prior to Visit  Medication Sig   aspirin EC 81 MG tablet Take 81 mg by mouth daily.   ferrous sulfate 325 (65 FE) MG EC tablet Take 1 tablet (325 mg total) by mouth daily for 14 days.   hydroxyurea (HYDREA) 500 MG capsule Take 1000 mg daily on M,W,F and take 500 mg on Tuesday, Thursday, Saturday, Sunday. Take with food.   Ibuprofen-diphenhydrAMINE HCl 200-25 MG CAPS Take 1 tablet by mouth at bedtime as needed (pain, sleep).  losartan (COZAAR) 50 MG tablet Take 1 tablet (50 mg total) by mouth daily.   minoxidil (LONITEN)  2.5 MG tablet Take 2 tablets (5 mg total) by mouth daily.   Multiple Vitamins-Minerals (MULTIVITAMIN & MINERAL PO) Take 1 tablet by mouth daily.   Probiotic Product (DAILY PROBIOTIC PO) Take 1 tablet by mouth daily.   Vitamin D, Ergocalciferol, (DRISDOL) 50000 units CAPS capsule Take 50,000 Units by mouth every Monday.    No facility-administered medications prior to visit.    Review of Systems  {Labs  Heme  Chem  Endocrine  Serology  Results Review (optional):23779}   Objective    There were no vitals taken for this visit. {Show previous vital signs (optional):23777}  Physical Exam  ***  No results found for any visits on 09/10/21.  Assessment & Plan     ***  No follow-ups on file.      {provider attestation***:1}   [name], PA-C  [facility] [phone] (phone) [phone] (fax)  [place]

## 2021-09-10 ENCOUNTER — Ambulatory Visit (INDEPENDENT_AMBULATORY_CARE_PROVIDER_SITE_OTHER): Payer: Medicare Other | Admitting: Physician Assistant

## 2021-09-10 ENCOUNTER — Encounter: Payer: Self-pay | Admitting: Physician Assistant

## 2021-09-10 DIAGNOSIS — I10 Essential (primary) hypertension: Secondary | ICD-10-CM

## 2021-09-10 NOTE — Assessment & Plan Note (Addendum)
Elevated in office, pt is nowtaking losartan 25 mg and minoxidil 5 mg  Advised he check at home for the next 2-3 weeks and return to office to discuss. Likely need to increase losartan to 50 mg again.

## 2021-09-10 NOTE — Patient Instructions (Signed)
Blood Pressure Record Sheet To take your blood pressure, you will need a blood pressure machine. You may be prescribed one, or you can buy a blood pressure machine (blood pressure monitor) at your clinic, drug store, or online. When choosing one, look for these features: An automatic monitor that has an arm cuff. A cuff that wraps snugly, but not too tightly, around your upper arm. You should be able to fit only one finger between your arm and the cuff. A device that stores blood pressure reading results. Do not choose a monitor that measures your blood pressure from your wrist or finger. Follow your health care provider's instructions for how to take your blood pressure. To use this form: Get one reading in the morning (a.m.) before you take any medicines. Get one reading in the evening (p.m.) before supper. Take at least two readings with each blood pressure check. This makes sure the results are correct. Wait 1-2 minutes between measurements. Write down the results in the spaces on this form. Repeat this once a week, or as told by your health care provider. Make a follow-up appointment with your health care provider to discuss the results. Blood pressure log Date: _______________________ a.m. _____________________(1st reading) _____________________(2nd reading) p.m. _____________________(1st reading) _____________________(2nd reading) Date: _______________________ a.m. _____________________(1st reading) _____________________(2nd reading) p.m. _____________________(1st reading) _____________________(2nd reading) Date: _______________________ a.m. _____________________(1st reading) _____________________(2nd reading) p.m. _____________________(1st reading) _____________________(2nd reading) Date: _______________________ a.m. _____________________(1st reading) _____________________(2nd reading) p.m. _____________________(1st reading) _____________________(2nd reading) Date:  _______________________ a.m. _____________________(1st reading) _____________________(2nd reading) p.m. _____________________(1st reading) _____________________(2nd reading) This information is not intended to replace advice given to you by your health care provider. Make sure you discuss any questions you have with your health care provider. Document Revised: 09/18/2020 Document Reviewed: 09/18/2020 Elsevier Patient Education  2023 Elsevier Inc.  

## 2021-10-08 ENCOUNTER — Ambulatory Visit: Payer: Medicare Other | Admitting: Physician Assistant

## 2021-10-08 NOTE — Progress Notes (Deleted)
I,Sha'taria Laurrie Toppin,acting as a Education administrator for [employer], PA-C.,have documented all relevant documentation on the behalf of [name], PA-C,as directed by  [name], PA-C while in the presence of [name], PA-C.   Established patient visit   Patient: Kenneth [name]   DOB: Jun 05, 1960   61 y.o. Male  MRN: [id number] Visit Date: 10/08/2021  Today's healthcare provider: [name], PA-C   No chief complaint on file.  Subjective    HPI  Hypertension, follow-up  BP Readings from Last 3 Encounters:  09/10/21 (!) 135/100  04/28/21 (!) 127/97  04/09/21 (!) 134/99   Wt Readings from Last 3 Encounters:  04/28/21 184 lb 4.8 oz (83.6 kg)  04/09/21 185 lb (83.9 kg)  08/21/18 227 lb (103 kg)     He was last seen for hypertension 1 months ago.  BP at that visit was 135/100. Management since that visit includes continue losartan 25 mg and minoxidil 5 mg .  He reports {excellent/good/fair/poor:19665} compliance with treatment. He {is/is not:9024} having side effects. {document side effects if present:1} He is following a {diet:21022986} diet. He {is/is not:9024} exercising. He {does/does not:200015} smoke.  Use of agents associated with hypertension: {bp agents assoc with hypertension:511::"none"}.   Outside blood pressures are {***enter patient reported home BP readings, or 'not being checked':1}. Symptoms: {Yes/No:20286} chest pain {Yes/No:20286} chest pressure  {Yes/No:20286} palpitations {Yes/No:20286} syncope  {Yes/No:20286} dyspnea {Yes/No:20286} orthopnea  {Yes/No:20286} paroxysmal nocturnal dyspnea {Yes/No:20286} lower extremity edema   Pertinent labs Lab Results  Component Value Date   CHOL 184 04/28/2021   HDL 54 04/28/2021   LDLCALC 119 (H) 04/28/2021   TRIG 57 04/28/2021   CHOLHDL 3.4 04/28/2021   Lab Results  Component Value Date   NA 137 04/09/2021   K 4.5 04/09/2021   CREATININE 0.75 04/09/2021   GFRNONAA >60 04/09/2021   GLUCOSE 93  04/09/2021   TSH 1.919 05/30/2017     The 10-year ASCVD risk score (Arnett DK, et al., 2019) is: 8.1%  ---------------------------------------------------------------------------------------------------   Medications: Outpatient Medications Prior to Visit  Medication Sig   aspirin EC 81 MG tablet Take 81 mg by mouth daily.   ferrous sulfate 325 (65 FE) MG EC tablet Take 1 tablet (325 mg total) by mouth daily for 14 days.   hydroxyurea (HYDREA) 500 MG capsule Take 1000 mg daily on M,W,F and take 500 mg on Tuesday, Thursday, Saturday, Sunday. Take with food.   Ibuprofen-diphenhydrAMINE HCl 200-25 MG CAPS Take 1 tablet by mouth at bedtime as needed (pain, sleep).   losartan (COZAAR) 50 MG tablet Take 1 tablet (50 mg total) by mouth daily. (Patient taking differently: Take 50 mg by mouth daily. Take 1/2 tablet daily)   minoxidil (LONITEN) 2.5 MG tablet Take 2 tablets (5 mg total) by mouth daily.   Multiple Vitamins-Minerals (MULTIVITAMIN & MINERAL PO) Take 1 tablet by mouth daily.   Probiotic Product (DAILY PROBIOTIC PO) Take 1 tablet by mouth daily.   Vitamin D, Ergocalciferol, (DRISDOL) 50000 units CAPS capsule Take 50,000 Units by mouth every Monday.    No facility-administered medications prior to visit.    Review of Systems  {Labs  Heme  Chem  Endocrine  Serology  Results Review (optional):23779}   Objective    There were no vitals taken for this visit. {Show previous vital signs (optional):23777}  Physical Exam  ***  No results found for any visits on 10/08/21.  Assessment & Plan     ***  No follow-ups on file.      {  provider attestation***:1}   [name], PA-C  [facility] [phone] (phone) [phone] (fax)  [place]

## 2021-10-12 ENCOUNTER — Ambulatory Visit (INDEPENDENT_AMBULATORY_CARE_PROVIDER_SITE_OTHER): Payer: Medicare Other | Admitting: Physician Assistant

## 2021-10-12 ENCOUNTER — Encounter: Payer: Self-pay | Admitting: Physician Assistant

## 2021-10-12 VITALS — BP 135/88 | HR 75 | Wt 184.3 lb

## 2021-10-12 DIAGNOSIS — G4709 Other insomnia: Secondary | ICD-10-CM | POA: Insufficient documentation

## 2021-10-12 DIAGNOSIS — I10 Essential (primary) hypertension: Secondary | ICD-10-CM | POA: Diagnosis not present

## 2021-10-12 NOTE — Progress Notes (Signed)
I,Sha'taria Tyson,acting as a Education administrator for [employer], PA-C.,have documented all relevant documentation on the behalf of [name], PA-C,as directed by  [name], PA-C while in the presence of [name], PA-C.   Established patient visit   Patient: Kenneth [name]   DOB: [birth year]   61 y.o. Male  MRN: [id number] Visit Date: 10/12/2021  Today's healthcare provider: [name], PA-C   Cc. Htn f/u  Subjective    HPI  Insomnia --Pt reports he typically falls asleep easily, but has a difficult time staying asleep-- wakes up 2-3 times during the night and sometimes cannot fall back to sleep. -admits to being on his phone right before bed. --denies nocturia.   Hypertension, follow-up  BP Readings from Last 3 Encounters:  10/12/21 135/88  09/10/21 (!) 135/100  04/28/21 (!) 127/97   Wt Readings from Last 3 Encounters:  10/12/21 184 lb 4.8 oz (83.6 kg)  04/28/21 184 lb 4.8 oz (83.6 kg)  04/09/21 185 lb (83.9 kg)     He was last seen for hypertension 4 weeks ago.  BP at that visit was 135/100. Management since that visit includes  losartan 25 mg and minoxidil 5 mg  Advised he check at home for the next 2-3 weeks and return to office to discuss.  He reports excellent compliance with treatment. He is not having side effects.  He is following a Low Sodium diet. He is not exercising. He does not smoke.  Use of agents associated with hypertension: none.   Outside blood pressures are not being checked Symptoms: No chest pain No chest pressure  No palpitations No syncope  No dyspnea No orthopnea  Yes paroxysmal nocturnal dyspnea No lower extremity edema   Pertinent labs Lab Results  Component Value Date   CHOL 184 04/28/2021   HDL 54 04/28/2021   LDLCALC 119 (H) 04/28/2021   TRIG 57 04/28/2021   CHOLHDL 3.4 04/28/2021   Lab Results  Component Value Date   NA 137 04/09/2021   K 4.5 04/09/2021   CREATININE 0.75 04/09/2021   GFRNONAA >60  04/09/2021   GLUCOSE 93 04/09/2021   TSH 1.919 05/30/2017     The 10-year ASCVD risk score (Arnett DK, et al., 2019) is: 8.1%  ---------------------------------------------------------------------------------------------------   Medications: Outpatient Medications Prior to Visit  Medication Sig   aspirin EC 81 MG tablet Take 81 mg by mouth daily.   ferrous sulfate 325 (65 FE) MG EC tablet Take 1 tablet (325 mg total) by mouth daily for 14 days.   hydroxyurea (HYDREA) 500 MG capsule Take 1000 mg daily on M,W,F and take 500 mg on Tuesday, Thursday, Saturday, Sunday. Take with food.   Ibuprofen-diphenhydrAMINE HCl 200-25 MG CAPS Take 1 tablet by mouth at bedtime as needed (pain, sleep).   losartan (COZAAR) 50 MG tablet Take 1 tablet (50 mg total) by mouth daily. (Patient taking differently: Take 50 mg by mouth daily. Take 1/2 tablet daily)   minoxidil (LONITEN) 2.5 MG tablet Take 2 tablets (5 mg total) by mouth daily.   Multiple Vitamins-Minerals (MULTIVITAMIN & MINERAL PO) Take 1 tablet by mouth daily.   Probiotic Product (DAILY PROBIOTIC PO) Take 1 tablet by mouth daily.   Vitamin D, Ergocalciferol, (DRISDOL) 50000 units CAPS capsule Take 50,000 Units by mouth every Monday.    No facility-administered medications prior to visit.    Review of Systems  Constitutional:  Negative for fatigue and fever.  Respiratory:  Negative for cough and shortness of breath.  Cardiovascular:  Negative for chest pain, palpitations and leg swelling.  Neurological:  Negative for dizziness and headaches.  Psychiatric/Behavioral:  Positive for sleep disturbance.       Objective    BP 135/88 (BP Location: Right Arm, Patient Position: Sitting, Cuff Size: Normal)   Pulse 75   Wt 184 lb 4.8 oz (83.6 kg)   SpO2 98%   BMI 24.32 kg/m  BP Readings from Last 3 Encounters:  10/12/21 135/88  09/10/21 (!) 135/100  04/28/21 (!) 127/97   Wt Readings from Last 3 Encounters:  10/12/21 184 lb 4.8 oz (83.6 kg)   04/28/21 184 lb 4.8 oz (83.6 kg)  04/09/21 185 lb (83.9 kg)      Physical Exam Vitals reviewed.  Constitutional:      Appearance: He is not ill-appearing.  HENT:     Head: Normocephalic.  Eyes:     Conjunctiva/sclera: Conjunctivae normal.  Cardiovascular:     Rate and Rhythm: Normal rate.  Pulmonary:     Effort: Pulmonary effort is normal. No respiratory distress.  Neurological:     General: No focal deficit present.     Mental Status: He is alert and oriented to person, place, and time.  Psychiatric:        Mood and Affect: Mood normal.        Behavior: Behavior normal.      No results found for any visits on 10/12/21.  Assessment & Plan     Problem List Items Addressed This Visit       Cardiovascular and Mediastinum   Primary hypertension - Primary    Now normal range in office. Pt still not checking at home. Continue losartan 25 mg and minoxidil 5 mg.  F/u 6 mo         Other   Other insomnia    Discussed sleep hygiene, advised magnesium glycinate, tart cherry juice, valerian root.          Return in about 6 months (around 04/12/2022) for AVW, CPE.      I, [name], PA-C have reviewed all documentation for this visit. The documentation on  10/12/2021  for the exam, diagnosis, procedures, and orders are all accurate and complete.  [name], PA-C [facility] [address] [place], [place], [address] Office: [phone] Fax: [phone]

## 2021-10-12 NOTE — Assessment & Plan Note (Addendum)
Now normal range in office. Pt still not checking at home. Continue losartan 25 mg and minoxidil 5 mg.  F/u 6 mo

## 2021-10-12 NOTE — Assessment & Plan Note (Signed)
Discussed sleep hygiene, advised magnesium glycinate, tart cherry juice, valerian root.

## 2021-10-12 NOTE — Patient Instructions (Addendum)
Magnesium Glycinate  Valerian Root -- sleepytime tea Tart cherry Juice   Make sure your bedroom is quiet, dark, relaxing, and at a comfortable temperature. Remove electronic devices, such as TVs, computers, and smart phones, from the bedroom. Avoid large meals, caffeine, and alcohol before bedtime.  Keep Your Routine Consistent: Following the same steps each night, including things like putting on your pajamas and brushing your teeth, can reinforce in your mind that it's bedtime.  Budget 30 Minutes For Winding Down: Take advantage of whatever puts you in a state of calm such as soft music, light stretching, reading, and/or relaxation exercises.  Dim Your Lights: Try to keep away from bright lights because they can hinder the production of melatonin, a hormone that the body creates to facilitate sleep.  Unplug From Electronics: Build in a 30-60 minute pre-bed buffer time that is device-free. Cell phones, tablets, and laptops cause mental stimulation that is hard to shut off and also generate blue light that may decrease melatonin production.  Test Methods of Relaxation: Instead of making falling asleep your goal, it's often easier to focus on relaxation. Meditation, mindfulness, paced breathing, and other relaxation techniques can put you in the right mindset for bed.

## 2021-11-05 ENCOUNTER — Encounter: Payer: Self-pay | Admitting: Oncology

## 2021-12-04 ENCOUNTER — Other Ambulatory Visit: Payer: Self-pay | Admitting: Physician Assistant

## 2021-12-04 DIAGNOSIS — I10 Essential (primary) hypertension: Secondary | ICD-10-CM

## 2022-02-01 ENCOUNTER — Other Ambulatory Visit: Payer: Self-pay | Admitting: Physician Assistant

## 2022-02-01 DIAGNOSIS — D45 Polycythemia vera: Secondary | ICD-10-CM

## 2022-02-01 DIAGNOSIS — I10 Essential (primary) hypertension: Secondary | ICD-10-CM

## 2022-02-01 NOTE — Telephone Encounter (Signed)
Medication Refill - Medication: hydroxyurea (HYDREA) 500 MG capsule  Has the patient contacted their pharmacy? Yes (Agent: If no, request that the patient contact the pharmacy for the refill. If patient does not wish to contact the pharmacy document the reason why and proceed with request.) (Agent: If yes, when and what did the pharmacy advise?)contact pcp  Preferred Pharmacy (with phone number or street name):  [id number] [place], [place]: [phone] Fax: [phone]    Has the patient been seen for an appointment in the last year OR does the patient have an upcoming appointment? yes  Agent: Please be advised that RX refills may take up to 3 business days. We ask that you follow-up with your pharmacy.

## 2022-02-02 NOTE — Telephone Encounter (Signed)
Requested medications are due for refill today.  yes  Requested medications are on the active medications list.  yes  Last refill. 04/28/2021 #114 3 rf  Future visit scheduled.   no  Notes to clinic.  Medication not assigned to a protocol. Please review for refill.    Requested Prescriptions  Pending Prescriptions Disp Refills   hydroxyurea (HYDREA) 500 MG capsule 114 capsule 3    Sig: Take 1000 mg daily on M,W,F and take 500 mg on Tuesday, Thursday, Saturday, Sunday. Take with food.     Off-Protocol Failed - 02/01/2022 12:33 PM      Failed - Medication not assigned to a protocol, review manually.      Passed - Valid encounter within last 12 months    Recent Outpatient Visits           3 months ago Primary hypertension   [facility] [name], PA-C   4 months ago Primary hypertension   [facility] Thedore Mins, [place], PA-C   9 months ago Primary hypertension   [facility] Thedore Mins, [place], PA-C   9 years ago Essential hypertension, benign   [facility], Janalee Dane, MD

## 2022-02-03 NOTE — Telephone Encounter (Signed)
Last refill 04/28/21. Please advise

## 2022-02-04 NOTE — Telephone Encounter (Signed)
Pt is calling to check on the status of the medication refill. Please advise  [phone]

## 2022-02-05 ENCOUNTER — Other Ambulatory Visit: Payer: Self-pay | Admitting: Nurse Practitioner

## 2022-02-05 DIAGNOSIS — D45 Polycythemia vera: Secondary | ICD-10-CM

## 2022-02-05 MED ORDER — HYDROXYUREA 500 MG PO CAPS: ORAL_CAPSULE | ORAL | 0 refills | Status: DC

## 2022-02-05 NOTE — Telephone Encounter (Signed)
Per Provider ok to give him a courtsey refill -- the NP at the cancer center said that he was supposed to follow up 4/23 and didn't. Does he want a referral to a new center?  Per patient no need for new referral that he was not aware that was his oncology and was not aware he needed to follow up with them. Gave patient number to the cancer center to schedule appointment to be follow-up by them and have further refills from his oncology. Patient verbalized understanding.

## 2022-02-05 NOTE — Progress Notes (Signed)
Patient's pcp contact me for refill of hydrea. Patient has not followed up as recommended and does not currently have future appointments scheduled. I will supply 30 day refill and have asked patient to contact [facility] for appointment.

## 2022-02-05 NOTE — Addendum Note (Signed)
Addended by: Doristine Devoid on: 02/05/2022 03:04 PM   Modules accepted: Orders

## 2022-02-05 NOTE — Telephone Encounter (Signed)
Patient reports that he doesn't have an Oncologist's at the moment. If PCP can please send a courtesy refill and refer him to an Oncologist please sot hat they can take over his med. Reports that he has been out for 2 days already.

## 2022-02-05 NOTE — Telephone Encounter (Signed)
Spoke to NP at cancer center; she okay'd a 30 day courtesy but he needs to make appt

## 2022-02-05 NOTE — Telephone Encounter (Signed)
Request refused: Medication never prescribed for the patient (this was only documentation. this rx needs to come from oncology.)

## 2022-02-08 ENCOUNTER — Encounter: Payer: Self-pay | Admitting: Oncology

## 2022-02-08 ENCOUNTER — Inpatient Hospital Stay: Payer: Medicare Other

## 2022-02-08 ENCOUNTER — Inpatient Hospital Stay: Payer: Medicare Other | Attending: Oncology | Admitting: Oncology

## 2022-02-08 VITALS — BP 154/104 | HR 80 | Temp 97.4°F | Resp 16 | Ht 73.0 in | Wt 191.0 lb

## 2022-02-08 DIAGNOSIS — D75839 Thrombocytosis, unspecified: Secondary | ICD-10-CM | POA: Insufficient documentation

## 2022-02-08 DIAGNOSIS — Z9081 Acquired absence of spleen: Secondary | ICD-10-CM | POA: Insufficient documentation

## 2022-02-08 DIAGNOSIS — D45 Polycythemia vera: Secondary | ICD-10-CM | POA: Insufficient documentation

## 2022-02-08 DIAGNOSIS — I10 Essential (primary) hypertension: Secondary | ICD-10-CM | POA: Diagnosis not present

## 2022-02-08 DIAGNOSIS — E611 Iron deficiency: Secondary | ICD-10-CM | POA: Insufficient documentation

## 2022-02-08 DIAGNOSIS — Z808 Family history of malignant neoplasm of other organs or systems: Secondary | ICD-10-CM | POA: Insufficient documentation

## 2022-02-08 DIAGNOSIS — D471 Chronic myeloproliferative disease: Secondary | ICD-10-CM | POA: Diagnosis not present

## 2022-02-08 LAB — CBC WITH DIFFERENTIAL/PLATELET
Abs Immature Granulocytes: 0.04 10*3/uL (ref 0.00–0.07)
Basophils Absolute: 0.3 10*3/uL — ABNORMAL HIGH (ref 0.0–0.1)
Basophils Relative: 2 %
Eosinophils Absolute: 0.1 10*3/uL (ref 0.0–0.5)
Eosinophils Relative: 1 %
HCT: 43.4 % (ref 39.0–52.0)
Hemoglobin: 14.2 g/dL (ref 13.0–17.0)
Immature Granulocytes: 0 %
Lymphocytes Relative: 17 %
Lymphs Abs: 2 10*3/uL (ref 0.7–4.0)
MCH: 26.1 pg (ref 26.0–34.0)
MCHC: 32.7 g/dL (ref 30.0–36.0)
MCV: 79.8 fL — ABNORMAL LOW (ref 80.0–100.0)
Monocytes Absolute: 1.1 10*3/uL — ABNORMAL HIGH (ref 0.1–1.0)
Monocytes Relative: 9 %
Neutro Abs: 8.7 10*3/uL — ABNORMAL HIGH (ref 1.7–7.7)
Neutrophils Relative %: 71 %
Platelets: 874 10*3/uL — ABNORMAL HIGH (ref 150–400)
RBC: 5.44 MIL/uL (ref 4.22–5.81)
RDW: 19.5 % — ABNORMAL HIGH (ref 11.5–15.5)
WBC: 12.2 10*3/uL — ABNORMAL HIGH (ref 4.0–10.5)
nRBC: 0 % (ref 0.0–0.2)

## 2022-02-08 LAB — COMPREHENSIVE METABOLIC PANEL
ALT: 13 U/L (ref 0–44)
AST: 18 U/L (ref 15–41)
Albumin: 4 g/dL (ref 3.5–5.0)
Alkaline Phosphatase: 73 U/L (ref 38–126)
Anion gap: 9 (ref 5–15)
BUN: 19 mg/dL (ref 6–20)
CO2: 26 mmol/L (ref 22–32)
Calcium: 8.6 mg/dL — ABNORMAL LOW (ref 8.9–10.3)
Chloride: 102 mmol/L (ref 98–111)
Creatinine, Ser: 0.88 mg/dL (ref 0.61–1.24)
GFR, Estimated: >60 mL/min (ref >60)
Glucose, Bld: 94 mg/dL (ref 70–99)
Potassium: 3.8 mmol/L (ref 3.5–5.1)
Sodium: 137 mmol/L (ref 135–145)
Total Bilirubin: 0.9 mg/dL (ref 0.3–1.2)
Total Protein: 7.8 g/dL (ref 6.5–8.1)

## 2022-02-08 LAB — RETIC PANEL
Immature Retic Fract: 14.5 % (ref 2.3–15.9)
RBC.: 5.42 MIL/uL (ref 4.22–5.81)
Retic Count, Absolute: 55.8 10*3/uL (ref 19.0–186.0)
Retic Ct Pct: 1 % (ref 0.4–3.1)
Reticulocyte Hemoglobin: 23.8 pg — ABNORMAL LOW (ref >27.9)

## 2022-02-08 LAB — FERRITIN: Ferritin: 16 ng/mL — ABNORMAL LOW (ref 24–336)

## 2022-02-08 LAB — IRON AND TIBC
Iron: 18 ug/dL — ABNORMAL LOW (ref 45–182)
Saturation Ratios: 4 % — ABNORMAL LOW (ref 17.9–39.5)
TIBC: 412 ug/dL (ref 250–450)
UIBC: 394 ug/dL

## 2022-02-08 MED ORDER — FERROUS SULFATE 325 (65 FE) MG PO TBEC: 325.0000 mg | DELAYED_RELEASE_TABLET | Freq: Every day | ORAL | 0 refills | Status: DC

## 2022-02-08 NOTE — Assessment & Plan Note (Addendum)
[name] 2 positive PV, age<65, no previous thrombosis events. No pre-treatment bone marrow biopsy in the past. 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 Labs are reviewed and discussed with patient. I recommend patient to continue hydroxyurea 500 mg Continue aspirin 81 mg.
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
## 2022-02-08 NOTE — Assessment & Plan Note (Signed)
I recommend short-term oral iron supplementation, ferrous sulfate prescription sent to pharmacy. This will help to clarify if thrombocytosis is partially secondary to iron deficiency

## 2022-02-08 NOTE — Assessment & Plan Note (Signed)
Thrombocytosis is likely multifactorial, secondary to underlying iron deficiency, s/p splenectomy, or secondary to myeloproliferative disease. Trials of short-term iron supplementation to see if any improvement.

## 2022-03-03 ENCOUNTER — Other Ambulatory Visit: Payer: Self-pay | Admitting: Nurse Practitioner

## 2022-03-03 DIAGNOSIS — D45 Polycythemia vera: Secondary | ICD-10-CM

## 2022-03-05 ENCOUNTER — Other Ambulatory Visit: Payer: Medicare Other

## 2022-03-08 ENCOUNTER — Encounter: Payer: Self-pay | Admitting: Oncology

## 2022-03-08 ENCOUNTER — Inpatient Hospital Stay: Payer: Medicare Other | Attending: Oncology

## 2022-03-08 ENCOUNTER — Inpatient Hospital Stay (HOSPITAL_BASED_OUTPATIENT_CLINIC_OR_DEPARTMENT_OTHER): Payer: Medicare Other | Admitting: Oncology

## 2022-03-08 VITALS — BP 153/109 | HR 75 | Temp 96.8°F | Resp 18 | Wt 198.4 lb

## 2022-03-08 DIAGNOSIS — D45 Polycythemia vera: Secondary | ICD-10-CM | POA: Insufficient documentation

## 2022-03-08 DIAGNOSIS — I10 Essential (primary) hypertension: Secondary | ICD-10-CM | POA: Insufficient documentation

## 2022-03-08 DIAGNOSIS — Z9081 Acquired absence of spleen: Secondary | ICD-10-CM | POA: Diagnosis not present

## 2022-03-08 DIAGNOSIS — D471 Chronic myeloproliferative disease: Secondary | ICD-10-CM | POA: Diagnosis not present

## 2022-03-08 DIAGNOSIS — D509 Iron deficiency anemia, unspecified: Secondary | ICD-10-CM | POA: Insufficient documentation

## 2022-03-08 DIAGNOSIS — E611 Iron deficiency: Secondary | ICD-10-CM | POA: Diagnosis not present

## 2022-03-08 DIAGNOSIS — D75839 Thrombocytosis, unspecified: Secondary | ICD-10-CM

## 2022-03-08 DIAGNOSIS — K625 Hemorrhage of anus and rectum: Secondary | ICD-10-CM | POA: Insufficient documentation

## 2022-03-08 LAB — RETIC PANEL
Immature Retic Fract: 17.5 % — ABNORMAL HIGH (ref 2.3–15.9)
RBC.: 5.68 MIL/uL (ref 4.22–5.81)
Retic Count, Absolute: 91.4 10*3/uL (ref 19.0–186.0)
Retic Ct Pct: 1.6 % (ref 0.4–3.1)
Reticulocyte Hemoglobin: 25.5 pg — ABNORMAL LOW (ref >27.9)

## 2022-03-08 LAB — CBC WITH DIFFERENTIAL/PLATELET
Abs Immature Granulocytes: 0.12 10*3/uL — ABNORMAL HIGH (ref 0.00–0.07)
Basophils Absolute: 0.4 10*3/uL — ABNORMAL HIGH (ref 0.0–0.1)
Basophils Relative: 2 %
Eosinophils Absolute: 0.2 10*3/uL (ref 0.0–0.5)
Eosinophils Relative: 1 %
HCT: 47.3 % (ref 39.0–52.0)
Hemoglobin: 14.8 g/dL (ref 13.0–17.0)
Immature Granulocytes: 1 %
Lymphocytes Relative: 12 %
Lymphs Abs: 2 10*3/uL (ref 0.7–4.0)
MCH: 26.1 pg (ref 26.0–34.0)
MCHC: 31.3 g/dL (ref 30.0–36.0)
MCV: 83.4 fL (ref 80.0–100.0)
Monocytes Absolute: 1.2 10*3/uL — ABNORMAL HIGH (ref 0.1–1.0)
Monocytes Relative: 7 %
Neutro Abs: 13.3 10*3/uL — ABNORMAL HIGH (ref 1.7–7.7)
Neutrophils Relative %: 77 %
Platelets: 1082 10*3/uL (ref 150–400)
RBC: 5.67 MIL/uL (ref 4.22–5.81)
RDW: 21.9 % — ABNORMAL HIGH (ref 11.5–15.5)
Smear Review: INCREASED
WBC: 17.2 10*3/uL — ABNORMAL HIGH (ref 4.0–10.5)
nRBC: 0.1 % (ref 0.0–0.2)

## 2022-03-08 LAB — IRON AND TIBC
Iron: 21 ug/dL — ABNORMAL LOW (ref 45–182)
Saturation Ratios: 5 % — ABNORMAL LOW (ref 17.9–39.5)
TIBC: 420 ug/dL (ref 250–450)
UIBC: 399 ug/dL

## 2022-03-08 LAB — PATHOLOGIST SMEAR REVIEW

## 2022-03-08 LAB — FERRITIN: Ferritin: 11 ng/mL — ABNORMAL LOW (ref 24–336)

## 2022-03-08 MED ORDER — HYDROXYUREA 500 MG PO CAPS: 1000.0000 mg | ORAL_CAPSULE | Freq: Every day | ORAL | 0 refills | Status: DC

## 2022-03-08 MED ORDER — FERROUS SULFATE 325 (65 FE) MG PO TBEC: 325.0000 mg | DELAYED_RELEASE_TABLET | Freq: Three times a day (TID) | ORAL | 0 refills | Status: DC

## 2022-03-08 MED ORDER — HYDROXYUREA 500 MG PO CAPS: ORAL_CAPSULE | ORAL | 0 refills | Status: DC

## 2022-03-08 NOTE — Assessment & Plan Note (Signed)
Refer to GI for evaluation.

## 2022-03-08 NOTE — Assessment & Plan Note (Addendum)
Discussed with him about option of IV venofer treatments, which may normalize iron store quicker. He declines due to concern of side effects. I recommend him to increase ferrous sulfate to 2-3 time daily.

## 2022-03-08 NOTE — Progress Notes (Signed)
Pt here for follow up. Pt reports that he needs a refill on Hydrea.
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
## 2022-03-08 NOTE — Assessment & Plan Note (Signed)
Thrombocytosis is likely multifactorial, secondary to underlying iron deficiency, s/p splenectomy, or secondary to myeloproliferative disease.

## 2022-03-08 NOTE — Assessment & Plan Note (Signed)
[name] 2 positive MPN, age<65, no previous thrombosis events. No pre-treatment bone marrow biopsy in the past. 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  Labs are reviewed and discussed with patient. Currently his platelet count is further elevated, > 74M.  I believe this is partially due to MPN, partially due to persistent Iron deficiency anemia.  He declined IV venofer, and agrees with increase oral iron supplementation 2-3 time daily.  I recommend to increase Hydroxyurea to 65mg BID, repeat CBC in 2 weeks.

## 2022-03-10 ENCOUNTER — Inpatient Hospital Stay: Payer: Medicare Other | Admitting: Oncology

## 2022-03-10 ENCOUNTER — Ambulatory Visit: Payer: Medicare Other | Admitting: Oncology

## 2022-03-22 ENCOUNTER — Inpatient Hospital Stay: Payer: Medicare Other | Attending: Oncology

## 2022-03-22 DIAGNOSIS — D45 Polycythemia vera: Secondary | ICD-10-CM

## 2022-03-22 DIAGNOSIS — D75839 Thrombocytosis, unspecified: Secondary | ICD-10-CM | POA: Diagnosis not present

## 2022-03-22 DIAGNOSIS — D471 Chronic myeloproliferative disease: Secondary | ICD-10-CM | POA: Insufficient documentation

## 2022-03-22 DIAGNOSIS — E611 Iron deficiency: Secondary | ICD-10-CM | POA: Insufficient documentation

## 2022-03-22 LAB — CBC WITH DIFFERENTIAL (CANCER CENTER ONLY)
Abs Immature Granulocytes: 0.13 10*3/uL — ABNORMAL HIGH (ref 0.00–0.07)
Basophils Absolute: 0.3 10*3/uL — ABNORMAL HIGH (ref 0.0–0.1)
Basophils Relative: 2 %
Eosinophils Absolute: 0.2 10*3/uL (ref 0.0–0.5)
Eosinophils Relative: 1 %
HCT: 51.6 % (ref 39.0–52.0)
Hemoglobin: 16.1 g/dL (ref 13.0–17.0)
Immature Granulocytes: 1 %
Lymphocytes Relative: 12 %
Lymphs Abs: 1.8 10*3/uL (ref 0.7–4.0)
MCH: 26.3 pg (ref 26.0–34.0)
MCHC: 31.2 g/dL (ref 30.0–36.0)
MCV: 84.3 fL (ref 80.0–100.0)
Monocytes Absolute: 0.9 10*3/uL (ref 0.1–1.0)
Monocytes Relative: 6 %
Neutro Abs: 11.1 10*3/uL — ABNORMAL HIGH (ref 1.7–7.7)
Neutrophils Relative %: 78 %
Platelet Count: 559 10*3/uL — ABNORMAL HIGH (ref 150–400)
RBC: 6.12 MIL/uL — ABNORMAL HIGH (ref 4.22–5.81)
RDW: 24.3 % — ABNORMAL HIGH (ref 11.5–15.5)
WBC Count: 14.4 10*3/uL — ABNORMAL HIGH (ref 4.0–10.5)
nRBC: 0 % (ref 0.0–0.2)

## 2022-03-22 LAB — IRON AND TIBC
Iron: 77 ug/dL (ref 45–182)
Saturation Ratios: 19 % (ref 17.9–39.5)
TIBC: 406 ug/dL (ref 250–450)
UIBC: 329 ug/dL

## 2022-03-22 LAB — FERRITIN: Ferritin: 14 ng/mL — ABNORMAL LOW (ref 24–336)

## 2022-03-30 ENCOUNTER — Other Ambulatory Visit: Payer: Self-pay | Admitting: Oncology

## 2022-04-02 ENCOUNTER — Encounter: Payer: Self-pay | Admitting: Oncology

## 2022-04-02 NOTE — Telephone Encounter (Signed)
Will refill at visit on 3/18, if appropriate.

## 2022-04-03 ENCOUNTER — Other Ambulatory Visit: Payer: Self-pay | Admitting: Oncology

## 2022-04-05 ENCOUNTER — Encounter: Payer: Self-pay | Admitting: Oncology

## 2022-04-05 ENCOUNTER — Inpatient Hospital Stay: Payer: Medicare Other

## 2022-04-05 ENCOUNTER — Inpatient Hospital Stay (HOSPITAL_BASED_OUTPATIENT_CLINIC_OR_DEPARTMENT_OTHER): Payer: Medicare Other | Admitting: Oncology

## 2022-04-05 VITALS — BP 124/87 | HR 83 | Temp 98.3°F | Resp 18 | Wt 189.9 lb

## 2022-04-05 DIAGNOSIS — E611 Iron deficiency: Secondary | ICD-10-CM | POA: Diagnosis not present

## 2022-04-05 DIAGNOSIS — D45 Polycythemia vera: Secondary | ICD-10-CM

## 2022-04-05 DIAGNOSIS — D75839 Thrombocytosis, unspecified: Secondary | ICD-10-CM

## 2022-04-05 DIAGNOSIS — K625 Hemorrhage of anus and rectum: Secondary | ICD-10-CM

## 2022-04-05 DIAGNOSIS — D471 Chronic myeloproliferative disease: Secondary | ICD-10-CM | POA: Diagnosis not present

## 2022-04-05 LAB — CMP (CANCER CENTER ONLY)
ALT: 13 U/L (ref 0–44)
AST: 17 U/L (ref 15–41)
Albumin: 3.9 g/dL (ref 3.5–5.0)
Alkaline Phosphatase: 90 U/L (ref 38–126)
Anion gap: 8 (ref 5–15)
BUN: 16 mg/dL (ref 6–20)
CO2: 26 mmol/L (ref 22–32)
Calcium: 8.5 mg/dL — ABNORMAL LOW (ref 8.9–10.3)
Chloride: 103 mmol/L (ref 98–111)
Creatinine: 0.89 mg/dL (ref 0.61–1.24)
GFR, Estimated: >60 mL/min (ref >60)
Glucose, Bld: 93 mg/dL (ref 70–99)
Potassium: 3.8 mmol/L (ref 3.5–5.1)
Sodium: 137 mmol/L (ref 135–145)
Total Bilirubin: 1 mg/dL (ref 0.3–1.2)
Total Protein: 7.3 g/dL (ref 6.5–8.1)

## 2022-04-05 LAB — CBC WITH DIFFERENTIAL (CANCER CENTER ONLY)
Abs Immature Granulocytes: 0.1 10*3/uL — ABNORMAL HIGH (ref 0.00–0.07)
Basophils Absolute: 0.3 10*3/uL — ABNORMAL HIGH (ref 0.0–0.1)
Basophils Relative: 2 %
Eosinophils Absolute: 0.2 10*3/uL (ref 0.0–0.5)
Eosinophils Relative: 1 %
HCT: 52.1 % — ABNORMAL HIGH (ref 39.0–52.0)
Hemoglobin: 16.6 g/dL (ref 13.0–17.0)
Immature Granulocytes: 1 %
Lymphocytes Relative: 11 %
Lymphs Abs: 1.5 10*3/uL (ref 0.7–4.0)
MCH: 27.6 pg (ref 26.0–34.0)
MCHC: 31.9 g/dL (ref 30.0–36.0)
MCV: 86.7 fL (ref 80.0–100.0)
Monocytes Absolute: 0.9 10*3/uL (ref 0.1–1.0)
Monocytes Relative: 6 %
Neutro Abs: 11.6 10*3/uL — ABNORMAL HIGH (ref 1.7–7.7)
Neutrophils Relative %: 79 %
Platelet Count: 803 10*3/uL — ABNORMAL HIGH (ref 150–400)
RBC: 6.01 MIL/uL — ABNORMAL HIGH (ref 4.22–5.81)
RDW: 25.2 % — ABNORMAL HIGH (ref 11.5–15.5)
WBC Count: 14.6 10*3/uL — ABNORMAL HIGH (ref 4.0–10.5)
nRBC: 0 % (ref 0.0–0.2)

## 2022-04-05 LAB — IRON AND TIBC
Iron: 19 ug/dL — ABNORMAL LOW (ref 45–182)
Saturation Ratios: 6 % — ABNORMAL LOW (ref 17.9–39.5)
TIBC: 340 ug/dL (ref 250–450)
UIBC: 321 ug/dL

## 2022-04-05 LAB — RETIC PANEL
Immature Retic Fract: 15.1 % (ref 2.3–15.9)
RBC.: 5.96 MIL/uL — ABNORMAL HIGH (ref 4.22–5.81)
Retic Count, Absolute: 78.1 10*3/uL (ref 19.0–186.0)
Retic Ct Pct: 1.3 % (ref 0.4–3.1)
Reticulocyte Hemoglobin: 29.8 pg (ref >27.9)

## 2022-04-05 LAB — FERRITIN: Ferritin: 16 ng/mL — ABNORMAL LOW (ref 24–336)

## 2022-04-05 MED ORDER — HYDROXYUREA 500 MG PO CAPS: 1000.0000 mg | ORAL_CAPSULE | Freq: Every day | ORAL | 1 refills | Status: DC
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
## 2022-04-05 NOTE — Progress Notes (Signed)
Pt here for follow up. Pt reports that he will need a refill on Hydrea

## 2022-04-05 NOTE — Assessment & Plan Note (Signed)
Refer to GI for evaluation. 

## 2022-04-05 NOTE — Telephone Encounter (Signed)
CBC with Differential ([facility] Only) Order: [id number] Status: Final result     Visible to patient: Yes (not seen)     Next appt: Today at 11:45 AM in Oncology ([facility])     Dx: Polycythemia vera ([place])   0 Result Notes          Component Ref Range & Units 2 wk ago (03/22/22) 4 wk ago (03/08/22) 1 mo ago (02/08/22) 8 mo ago (07/29/21) 8 mo ago (07/29/21) 12 mo ago (04/09/21) 3 yr ago (05/22/18)  WBC Count 4.0 - 10.5 K/uL 14.4 High  17.2 High  12.2 High   10.6 R 23.8 High  17.3 High   RBC 4.22 - 5.81 MIL/uL 6.12 High  5.67 5.44 5.12 Abnormal  R  5.77 6.09 High   Hemoglobin 13.0 - 17.0 g/dL 16.1 14.8 14.2  14.0 R 14.1 14.8  HCT 39.0 - 52.0 % 51.6 47.3 43.4  43 R 45.0 48.8  MCV 80.0 - 100.0 fL 84.3 83.4 79.8 Low    78.0 Low  80.1  MCH 26.0 - 34.0 pg 26.3 26.1 26.1   24.4 Low  24.3 Low   MCHC 30.0 - 36.0 g/dL 31.2 31.3 32.7   31.3 30.3  RDW 11.5 - 15.5 % 24.3 High  21.9 High  19.5 High    21.2 High  21.2 High   Platelet Count 150 - 400 K/uL 559 High  1,082 High Panic  CM 874 High   398 806 High  718 High   nRBC 0.0 - 0.2 % 0.0 0.1 0.0   0.1 0.0  Neutrophils Relative % % 78 77 71   80 76  Neutro Abs 1.7 - 7.7 K/uL 11.1 High  13.3 High  8.7 High    19.0 High  13.2 High   Lymphocytes Relative % 12 12 17   10 11   Lymphs Abs 0.7 - 4.0 K/uL 1.8 2.0 2.0   2.3 1.9  Monocytes Relative % 6 7 9   6 8   Monocytes Absolute 0.1 - 1.0 K/uL 0.9 1.2 High  1.1 High    1.5 High  1.4 High   Eosinophils Relative % 1 1 1   1 2   Eosinophils Absolute 0.0 - 0.5 K/uL 0.2 0.2 0.1   0.3 0.3  Basophils Relative % 2 2 2   2 2   Basophils Absolute 0.0 - 0.1 K/uL 0.3 High  0.4 High  0.3 High    0.5 High  0.3 High   Immature Granulocytes % 1 1 0   1 1  Abs Immature Granulocytes 0.00 - 0.07 K/uL 0.13 High  0.12 High  CM 0.04 CM   0.15 High  CM 0.15 High   Comment: Performed at [facility], [address] [place], [place] [address]  WBC Morphology  MORPHOLOGY UNREMARKABLE      MORPHOLOGY UNREMARKABLE  RBC Morphology  MORPHOLOGY UNREMARKABLE       Smear Review  PLATELETS APPEAR INCREASED CM     Normal platelet morphology  Polychromasia       PRESENT  Target Cells       PRESENT CM  Resulting Agency [facility] Abstrct Entry Abstrct Entry [facility]         Specimen Collected: 03/22/22 14:39 Last Resulted: 03/22/22 14:53

## 2022-04-05 NOTE — Assessment & Plan Note (Signed)
Iron panel slightly improved.  However hemoglobin starts to increase as well.   Recommend patient to hold off iron supplementation at this point.

## 2022-04-05 NOTE — Assessment & Plan Note (Signed)
Thrombocytosis is likely multifactorial, secondary to underlying iron deficiency, s/p splenectomy, or secondary to myeloproliferative disease. 

## 2022-04-05 NOTE — Assessment & Plan Note (Addendum)
[name] 2 positive MPN, age<65, no previous thrombosis events. No pre-treatment bone marrow biopsy in the past. 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  Patient has been on hydroxyurea 1000 mg 3 times per week, and then 500 mg daily for the rest of the days. Labs are reviewed and discussed with patient. Her platelet count has decreased to 500s, and bili increased to 800s. I recommend to increase Hydroxyurea to 1000 mg daily. Patient has questions regarding normal therapy options.  We discussed in details and all questions were answered to his satisfaction.

## 2022-04-13 ENCOUNTER — Ambulatory Visit: Payer: Medicare Other

## 2022-04-13 ENCOUNTER — Ambulatory Visit: Payer: Medicare Other | Admitting: Physician Assistant

## 2022-05-06 ENCOUNTER — Inpatient Hospital Stay: Payer: Medicare Other | Attending: Oncology | Admitting: Oncology

## 2022-05-06 ENCOUNTER — Inpatient Hospital Stay: Payer: Medicare Other

## 2022-05-06 ENCOUNTER — Other Ambulatory Visit: Payer: Medicare Other

## 2022-05-06 ENCOUNTER — Encounter: Payer: Self-pay | Admitting: Oncology

## 2022-05-06 VITALS — BP 140/96 | HR 79 | Temp 96.9°F | Wt 191.1 lb

## 2022-05-06 DIAGNOSIS — Z808 Family history of malignant neoplasm of other organs or systems: Secondary | ICD-10-CM | POA: Diagnosis not present

## 2022-05-06 DIAGNOSIS — Z9081 Acquired absence of spleen: Secondary | ICD-10-CM | POA: Insufficient documentation

## 2022-05-06 DIAGNOSIS — D471 Chronic myeloproliferative disease: Secondary | ICD-10-CM | POA: Insufficient documentation

## 2022-05-06 DIAGNOSIS — E611 Iron deficiency: Secondary | ICD-10-CM

## 2022-05-06 DIAGNOSIS — K625 Hemorrhage of anus and rectum: Secondary | ICD-10-CM

## 2022-05-06 DIAGNOSIS — I10 Essential (primary) hypertension: Secondary | ICD-10-CM | POA: Insufficient documentation

## 2022-05-06 DIAGNOSIS — D45 Polycythemia vera: Secondary | ICD-10-CM | POA: Diagnosis present

## 2022-05-06 LAB — CBC WITH DIFFERENTIAL (CANCER CENTER ONLY)
Abs Immature Granulocytes: 0.09 10*3/uL — ABNORMAL HIGH (ref 0.00–0.07)
Basophils Absolute: 0.3 10*3/uL — ABNORMAL HIGH (ref 0.0–0.1)
Basophils Relative: 2 %
Eosinophils Absolute: 0.2 10*3/uL (ref 0.0–0.5)
Eosinophils Relative: 1 %
HCT: 51.2 % (ref 39.0–52.0)
Hemoglobin: 16.5 g/dL (ref 13.0–17.0)
Immature Granulocytes: 1 %
Lymphocytes Relative: 11 %
Lymphs Abs: 1.5 10*3/uL (ref 0.7–4.0)
MCH: 27.7 pg (ref 26.0–34.0)
MCHC: 32.2 g/dL (ref 30.0–36.0)
MCV: 85.9 fL (ref 80.0–100.0)
Monocytes Absolute: 1 10*3/uL (ref 0.1–1.0)
Monocytes Relative: 7 %
Neutro Abs: 11 10*3/uL — ABNORMAL HIGH (ref 1.7–7.7)
Neutrophils Relative %: 78 %
Platelet Count: 781 10*3/uL — ABNORMAL HIGH (ref 150–400)
RBC: 5.96 MIL/uL — ABNORMAL HIGH (ref 4.22–5.81)
RDW: 22.1 % — ABNORMAL HIGH (ref 11.5–15.5)
WBC Count: 14 10*3/uL — ABNORMAL HIGH (ref 4.0–10.5)
nRBC: 0 % (ref 0.0–0.2)

## 2022-05-06 LAB — FERRITIN: Ferritin: 8 ng/mL — ABNORMAL LOW (ref 24–336)

## 2022-05-06 LAB — CMP (CANCER CENTER ONLY)
ALT: 14 U/L (ref 0–44)
AST: 19 U/L (ref 15–41)
Albumin: 4.1 g/dL (ref 3.5–5.0)
Alkaline Phosphatase: 86 U/L (ref 38–126)
Anion gap: 8 (ref 5–15)
BUN: 12 mg/dL (ref 6–20)
CO2: 26 mmol/L (ref 22–32)
Calcium: 8.7 mg/dL — ABNORMAL LOW (ref 8.9–10.3)
Chloride: 105 mmol/L (ref 98–111)
Creatinine: 0.74 mg/dL (ref 0.61–1.24)
GFR, Estimated: >60 mL/min (ref >60)
Glucose, Bld: 89 mg/dL (ref 70–99)
Potassium: 3.6 mmol/L (ref 3.5–5.1)
Sodium: 139 mmol/L (ref 135–145)
Total Bilirubin: 1.2 mg/dL (ref 0.3–1.2)
Total Protein: 7.5 g/dL (ref 6.5–8.1)

## 2022-05-06 LAB — RETIC PANEL
Immature Retic Fract: 18.8 % — ABNORMAL HIGH (ref 2.3–15.9)
RBC.: 6.01 MIL/uL — ABNORMAL HIGH (ref 4.22–5.81)
Retic Count, Absolute: 57.7 10*3/uL (ref 19.0–186.0)
Retic Ct Pct: 1 % (ref 0.4–3.1)
Reticulocyte Hemoglobin: 24.8 pg — ABNORMAL LOW (ref >27.9)

## 2022-05-06 LAB — IRON AND TIBC
Iron: 30 ug/dL — ABNORMAL LOW (ref 45–182)
Saturation Ratios: 8 % — ABNORMAL LOW (ref 17.9–39.5)
TIBC: 377 ug/dL (ref 250–450)
UIBC: 347 ug/dL

## 2022-05-06 MED ORDER — HYDROXYUREA 500 MG PO CAPS: 1000.0000 mg | ORAL_CAPSULE | Freq: Every day | ORAL | 1 refills | Status: DC

## 2022-05-06 NOTE — Assessment & Plan Note (Addendum)
Iron panel is worse.   I wonder if the iron deficiency attributes to his thrombocytosis.  Recommend patient to take Ferrous sulfate once daily.  Take OTC stool softner for constipation.

## 2022-05-06 NOTE — Assessment & Plan Note (Signed)
Refer to GI for evaluation. He has upcoming appt with GI

## 2022-05-06 NOTE — Assessment & Plan Note (Addendum)
[name] 2 positive MPN, age<65, no previous thrombosis events. No pre-treatment bone marrow biopsy in the past. 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  Labs are reviewed and discussed with patient. Lab Results  Component Value Date   HGB 16.5 05/06/2022   PLT 781 (H) 05/06/2022    I recommend to increase Hydroxyurea to 1000 mg daily. Patient has questions regarding normal therapy options.  We discussed in details and all questions were answered to his satisfaction.
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
## 2022-06-02 ENCOUNTER — Inpatient Hospital Stay: Payer: Medicare Other | Attending: Oncology

## 2022-06-02 DIAGNOSIS — D471 Chronic myeloproliferative disease: Secondary | ICD-10-CM | POA: Insufficient documentation

## 2022-06-02 DIAGNOSIS — E611 Iron deficiency: Secondary | ICD-10-CM | POA: Insufficient documentation

## 2022-06-02 LAB — CBC WITH DIFFERENTIAL (CANCER CENTER ONLY)
Abs Immature Granulocytes: 0.05 10*3/uL (ref 0.00–0.07)
Basophils Absolute: 0.3 10*3/uL — ABNORMAL HIGH (ref 0.0–0.1)
Basophils Relative: 2 %
Eosinophils Absolute: 0.1 10*3/uL (ref 0.0–0.5)
Eosinophils Relative: 1 %
HCT: 49.8 % (ref 39.0–52.0)
Hemoglobin: 16.1 g/dL (ref 13.0–17.0)
Immature Granulocytes: 0 %
Lymphocytes Relative: 15 %
Lymphs Abs: 1.9 10*3/uL (ref 0.7–4.0)
MCH: 27.6 pg (ref 26.0–34.0)
MCHC: 32.3 g/dL (ref 30.0–36.0)
MCV: 85.3 fL (ref 80.0–100.0)
Monocytes Absolute: 1 10*3/uL (ref 0.1–1.0)
Monocytes Relative: 8 %
Neutro Abs: 9.5 10*3/uL — ABNORMAL HIGH (ref 1.7–7.7)
Neutrophils Relative %: 74 %
Platelet Count: 549 10*3/uL — ABNORMAL HIGH (ref 150–400)
RBC: 5.84 MIL/uL — ABNORMAL HIGH (ref 4.22–5.81)
RDW: 21 % — ABNORMAL HIGH (ref 11.5–15.5)
WBC Count: 12.8 10*3/uL — ABNORMAL HIGH (ref 4.0–10.5)
nRBC: 0 % (ref 0.0–0.2)

## 2022-06-02 LAB — IRON AND TIBC
Iron: 23 ug/dL — ABNORMAL LOW (ref 45–182)
Saturation Ratios: 6 % — ABNORMAL LOW (ref 17.9–39.5)
TIBC: 395 ug/dL (ref 250–450)
UIBC: 372 ug/dL

## 2022-06-02 LAB — RETIC PANEL
Immature Retic Fract: 9.6 % (ref 2.3–15.9)
RBC.: 5.9 MIL/uL — ABNORMAL HIGH (ref 4.22–5.81)
Retic Count, Absolute: 56.6 10*3/uL (ref 19.0–186.0)
Retic Ct Pct: 1 % (ref 0.4–3.1)
Reticulocyte Hemoglobin: 27.9 pg — ABNORMAL LOW (ref >27.9)

## 2022-06-02 LAB — FERRITIN: Ferritin: 9 ng/mL — ABNORMAL LOW (ref 24–336)

## 2022-06-03 ENCOUNTER — Other Ambulatory Visit: Payer: Medicare Other

## 2022-06-03 ENCOUNTER — Inpatient Hospital Stay: Payer: Medicare Other

## 2022-06-08 ENCOUNTER — Encounter: Payer: Self-pay | Admitting: Physician Assistant

## 2022-06-08 ENCOUNTER — Ambulatory Visit (INDEPENDENT_AMBULATORY_CARE_PROVIDER_SITE_OTHER): Payer: Medicare Other | Admitting: Physician Assistant

## 2022-06-08 ENCOUNTER — Ambulatory Visit (INDEPENDENT_AMBULATORY_CARE_PROVIDER_SITE_OTHER): Payer: Medicare Other

## 2022-06-08 VITALS — BP 120/78 | HR 92 | Ht 73.0 in | Wt 189.0 lb

## 2022-06-08 VITALS — BP 120/78 | Ht 73.0 in | Wt 189.7 lb

## 2022-06-08 DIAGNOSIS — D471 Chronic myeloproliferative disease: Secondary | ICD-10-CM

## 2022-06-08 DIAGNOSIS — I10 Essential (primary) hypertension: Secondary | ICD-10-CM | POA: Diagnosis not present

## 2022-06-08 DIAGNOSIS — Z0000 Encounter for general adult medical examination without abnormal findings: Secondary | ICD-10-CM | POA: Diagnosis not present

## 2022-06-08 DIAGNOSIS — R7989 Other specified abnormal findings of blood chemistry: Secondary | ICD-10-CM | POA: Diagnosis not present

## 2022-06-08 DIAGNOSIS — E7800 Pure hypercholesterolemia, unspecified: Secondary | ICD-10-CM | POA: Diagnosis not present

## 2022-06-08 DIAGNOSIS — R351 Nocturia: Secondary | ICD-10-CM | POA: Diagnosis not present

## 2022-06-08 NOTE — Patient Instructions (Addendum)
Kenneth [name] , Thank you for taking time to come for your Medicare Wellness Visit. I appreciate your ongoing commitment to your health goals. Please review the following plan we discussed and let me know if I can assist you in the future.   These are the goals we discussed:  Goals   None     This is a list of the screening recommended for you and due dates:  Health Maintenance  Topic Date Due   COVID-19 Vaccine (1) Never done   Hepatitis C Screening: USPSTF Recommendation to screen - Ages 73-79 yo.  Never done   Zoster (Shingles) Vaccine (1 of 2) Never done   DTaP/Tdap/Td vaccine (2 - Td or Tdap) 01/18/2022   Flu Shot  08/19/2022   Medicare Annual Wellness Visit  06/08/2023   Colon Cancer Screening  08/20/2028   HIV Screening  Completed   HPV Vaccine  Aged Out    Advanced directives: yes  Conditions/risks identified: moderate fall risk  Next appointment: Follow up in one year for your annual wellness visit 06/14/2023 @ 2pm in person  Preventive Care 40-64 Years, Male Preventive care refers to lifestyle choices and visits with your health care provider that can promote health and wellness. What does preventive care include? A yearly physical exam. This is also called an annual well check. Dental exams once or twice a year. Routine eye exams. Ask your health care provider how often you should have your eyes checked. Personal lifestyle choices, including: Daily care of your teeth and gums. Regular physical activity. Eating a healthy diet. Avoiding tobacco and drug use. Limiting alcohol use. Practicing safe sex. Taking low-dose aspirin every day starting at age 25. What happens during an annual well check? The services and screenings done by your health care provider during your annual well check will depend on your age, overall health, lifestyle risk factors, and family history of disease. Counseling  Your health care provider may ask you questions about your: Alcohol  use. Tobacco use. Drug use. Emotional well-being. Home and relationship well-being. Sexual activity. Eating habits. Work and work Astronomer. Screening  You may have the following tests or measurements: Height, weight, and BMI. Blood pressure. Lipid and cholesterol levels. These may be checked every 5 years, or more frequently if you are over 68 years old. Skin check. Lung cancer screening. You may have this screening every year starting at age 34 if you have a 30-pack-year history of smoking and currently smoke or have quit within the past 15 years. Fecal occult blood test (FOBT) of the stool. You may have this test every year starting at age 16. Flexible sigmoidoscopy or colonoscopy. You may have a sigmoidoscopy every 5 years or a colonoscopy every 10 years starting at age 73. Prostate cancer screening. Recommendations will vary depending on your family history and other risks. Hepatitis C blood test. Hepatitis B blood test. Sexually transmitted disease (STD) testing. Diabetes screening. This is done by checking your blood sugar (glucose) after you have not eaten for a while (fasting). You may have this done every 1-3 years. Discuss your test results, treatment options, and if necessary, the need for more tests with your health care provider. Vaccines  Your health care provider may recommend certain vaccines, such as: Influenza vaccine. This is recommended every year. Tetanus, diphtheria, and acellular pertussis (Tdap, Td) vaccine. You may need a Td booster every 10 years. Zoster vaccine. You may need this after age 56. Pneumococcal 13-valent conjugate (PCV13) vaccine. You may need  this if you have certain conditions and have not been vaccinated. Pneumococcal polysaccharide (PPSV23) vaccine. You may need one or two doses if you smoke cigarettes or if you have certain conditions. Talk to your health care provider about which screenings and vaccines you need and how often you need  them. This information is not intended to replace advice given to you by your health care provider. Make sure you discuss any questions you have with your health care provider. Document Released: 01/31/2015 Document Revised: 09/24/2015 Document Reviewed: 11/05/2014 Elsevier Interactive Patient Education  2017 [employer].  Fall Prevention in the Home Falls can cause injuries. They can happen to people of all ages. There are many things you can do to make your home safe and to help prevent falls. What can I do on the outside of my home? Regularly fix the edges of walkways and driveways and fix any cracks. Remove anything that might make you trip as you walk through a door, such as a raised step or threshold. Trim any bushes or trees on the path to your home. Use bright outdoor lighting. Clear any walking paths of anything that might make someone trip, such as rocks or tools. Regularly check to see if handrails are loose or broken. Make sure that both sides of any steps have handrails. Any raised decks and porches should have guardrails on the edges. Have any leaves, snow, or ice cleared regularly. Use sand or salt on walking paths during winter. Clean up any spills in your garage right away. This includes oil or grease spills. What can I do in the bathroom? Use night lights. Install grab bars by the toilet and in the tub and shower. Do not use towel bars as grab bars. Use non-skid mats or decals in the tub or shower. If you need to sit down in the shower, use a plastic, non-slip stool. Keep the floor dry. Clean up any water that spills on the floor as soon as it happens. Remove soap buildup in the tub or shower regularly. Attach bath mats securely with double-sided non-slip rug tape. Do not have throw rugs and other things on the floor that can make you trip. What can I do in the bedroom? Use night lights. Make sure that you have a light by your bed that is easy to reach. Do not use  any sheets or blankets that are too big for your bed. They should not hang down onto the floor. Have a firm chair that has side arms. You can use this for support while you get dressed. Do not have throw rugs and other things on the floor that can make you trip. What can I do in the kitchen? Clean up any spills right away. Avoid walking on wet floors. Keep items that you use a lot in easy-to-reach places. If you need to reach something above you, use a strong step stool that has a grab bar. Keep electrical cords out of the way. Do not use floor polish or wax that makes floors slippery. If you must use wax, use non-skid floor wax. Do not have throw rugs and other things on the floor that can make you trip. What can I do with my stairs? Do not leave any items on the stairs. Make sure that there are handrails on both sides of the stairs and use them. Fix handrails that are broken or loose. Make sure that handrails are as long as the stairways. Check any carpeting to make sure that  it is firmly attached to the stairs. Fix any carpet that is loose or worn. Avoid having throw rugs at the top or bottom of the stairs. If you do have throw rugs, attach them to the floor with carpet tape. Make sure that you have a light switch at the top of the stairs and the bottom of the stairs. If you do not have them, ask someone to add them for you. What else can I do to help prevent falls? Wear shoes that: Do not have high heels. Have rubber bottoms. Are comfortable and fit you well. Are closed at the toe. Do not wear sandals. If you use a stepladder: Make sure that it is fully opened. Do not climb a closed stepladder. Make sure that both sides of the stepladder are locked into place. Ask someone to hold it for you, if possible. Clearly mark and make sure that you can see: Any grab bars or handrails. First and last steps. Where the edge of each step is. Use tools that help you move around (mobility aids)  if they are needed. These include: Canes. Walkers. Scooters. Crutches. Turn on the lights when you go into a dark area. Replace any light bulbs as soon as they burn out. Set up your furniture so you have a clear path. Avoid moving your furniture around. If any of your floors are uneven, fix them. If there are any pets around you, be aware of where they are. Review your medicines with your doctor. Some medicines can make you feel dizzy. This can increase your chance of falling. Ask your doctor what other things that you can do to help prevent falls. This information is not intended to replace advice given to you by your health care provider. Make sure you discuss any questions you have with your health care provider. Document Released: 10/31/2008 Document Revised: 06/12/2015 Document Reviewed: 02/08/2014 Elsevier Interactive Patient Education  2017 [employer].

## 2022-06-08 NOTE — Progress Notes (Unsigned)
Established patient visit   I,Roshena L Chambers,acting as a scribe for [employer], PA-C.,have documented all relevant documentation on the behalf of Alfredia Ferguson, PA-C,as directed by  Alfredia Ferguson, PA-C while in the presence of Alfredia Ferguson, PA-C.   Patient: Kenneth [name]   DOB: Dec 23, 1960   62 y.o. Male  MRN: [id number] Visit Date: 06/08/2022  Today's healthcare provider: Alfredia Ferguson, PA-C   Chief Complaint  Patient presents with   Medical Management of Chronic Issues   Subjective    HPI  Had AWV with [facility] today at 1pm. He denies concerns today.  Hypertension, follow-up  BP Readings from Last 3 Encounters:  06/08/22 120/78  06/08/22 120/78  05/06/22 (!) 140/96   Wt Readings from Last 3 Encounters:  06/08/22 189 lb (85.7 kg)  06/08/22 189 lb 11.2 oz (86 kg)  05/06/22 191 lb 1.6 oz (86.7 kg)      Pertinent labs Lab Results  Component Value Date   CHOL 235 (H) 06/08/2022   HDL 48 06/08/2022   LDLCALC 174 (H) 06/08/2022   TRIG 75 06/08/2022   CHOLHDL 4.9 06/08/2022   Lab Results  Component Value Date   NA 139 05/06/2022   K 3.6 05/06/2022   CREATININE 0.74 05/06/2022   GFRNONAA >60 05/06/2022   GLUCOSE 89 05/06/2022   TSH 1.919 05/30/2017     The ASCVD Risk score (Arnett DK, et al., 2019) failed to calculate for the following reasons:   The patient has a prior MI or stroke diagnosis  ---------------------------------------------------------------------------------------------------  Medications: Outpatient Medications Prior to Visit  Medication Sig   aspirin EC 81 MG tablet Take 81 mg by mouth daily.   hydroxyurea (HYDREA) 500 MG capsule Take 2 capsules (1,000 mg total) by mouth daily. May take with food to minimize GI side effects.   Ibuprofen-diphenhydrAMINE HCl 200-25 MG CAPS Take 1 tablet by mouth at bedtime as needed (pain, sleep).   losartan (COZAAR) 50 MG tablet Take 0.5 tablets (25 mg total) by mouth daily.   minoxidil  (LONITEN) 2.5 MG tablet TAKE 2 TABLETS BY MOUTH DAILY   Multiple Vitamins-Minerals (MULTIVITAMIN & MINERAL PO) Take 1 tablet by mouth daily.   Probiotic Product (DAILY PROBIOTIC PO) Take 1 tablet by mouth daily.   testosterone (ANDROGEL) 50 MG/5GM (1%) GEL Place 5 g onto the skin daily.   Vitamin D, Ergocalciferol, (DRISDOL) 50000 units CAPS capsule Take 50,000 Units by mouth every Monday. (Patient not taking: Reported on 06/08/2022)   No facility-administered medications prior to visit.   Review of Systems  Constitutional:  Negative for appetite change, chills and fever.  Respiratory:  Negative for chest tightness, shortness of breath and wheezing.   Cardiovascular:  Negative for chest pain and palpitations.  Gastrointestinal:  Negative for abdominal pain, nausea and vomiting.     Objective    BP 120/78   Pulse 92   Ht 6\' 1"  (1.854 m)   Wt 189 lb (85.7 kg)   SpO2 97% Comment: room air  BMI 24.94 kg/m    Today's Vitals   06/08/22 1304  BP: 120/78  Pulse: 92  SpO2: 97%  Weight: 189 lb (85.7 kg)  Height: 6\' 1"  (1.854 m)   Body mass index is 24.94 kg/m.   Physical Exam Constitutional:      General: He is awake.     Appearance: He is well-developed.  HENT:     Head: Normocephalic.  Eyes:     Conjunctiva/sclera: Conjunctivae normal.  Cardiovascular:     Rate and Rhythm: Normal rate and regular rhythm.     Heart sounds: Normal heart sounds.  Pulmonary:     Effort: Pulmonary effort is normal.     Breath sounds: Normal breath sounds.  Skin:    General: Skin is warm.  Neurological:     Mental Status: He is alert and oriented to person, place, and time.  Psychiatric:        Attention and Perception: Attention normal.        Mood and Affect: Mood normal.        Speech: Speech normal.        Behavior: Behavior is cooperative.      Results for orders placed or performed in visit on 06/08/22  Lipid Profile  Result Value Ref Range   Cholesterol, Total 235 (H) 100 -  199 mg/dL   Triglycerides 75 0 - 149 mg/dL   HDL 48 >04 mg/dL   VLDL Cholesterol Cal 13 5 - 40 mg/dL   LDL Chol Calc (NIH) 540 (H) 0 - 99 mg/dL   Chol/HDL Ratio 4.9 0.0 - 5.0 ratio  PSA  Result Value Ref Range   Prostate Specific Ag, Serum 0.9 0.0 - 4.0 ng/mL  Testosterone,Free and Total  Result Value Ref Range   Testosterone 851 264 - 916 ng/dL   Testosterone, Free WILL FOLLOW     Assessment & Plan     Problem List Items Addressed This Visit       Cardiovascular and Mediastinum   Primary hypertension - Primary    Well controlled Continue losartan 25 mg and minoxidil 5 mg  F/u 6 mo         Other   Myeloproliferative disorder (HCC) (Chronic)    F/b heme      Hypercholesterolemia    Unmedicated was well controlled w/ diet/exercise previously Repeat fasting lipids      Relevant Orders   Lipid Profile (Completed)   Low testosterone    Pt now treated, will repeat testosterone levels      Relevant Orders   Testosterone,Free and Total   Nocturia   Relevant Orders   PSA (Completed)     Return in about 6 months (around 12/09/2022) for chronic conditions.      I, Alfredia Ferguson, PA-C have reviewed all documentation for this visit. The documentation on  06/09/22   for the exam, diagnosis, procedures, and orders are all accurate and complete.  Alfredia Ferguson, PA-C [facility] [address] [place], [place], [address] Office: [phone] Fax: [phone]   [facility]
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
## 2022-06-09 ENCOUNTER — Encounter: Payer: Self-pay | Admitting: Physician Assistant

## 2022-06-09 LAB — LIPID PANEL
Cholesterol, Total: 235 mg/dL — ABNORMAL HIGH (ref 100–199)
LDL Chol Calc (NIH): 174 mg/dL — ABNORMAL HIGH (ref 0–99)

## 2022-06-09 LAB — PSA: Prostate Specific Ag, Serum: 0.9 ng/mL (ref 0.0–4.0)

## 2022-06-09 LAB — TESTOSTERONE,FREE AND TOTAL: Testosterone: 851 ng/dL (ref 264–916)

## 2022-06-09 NOTE — Assessment & Plan Note (Signed)
Well controlled Continue losartan 25 mg and minoxidil 5 mg  F/u 6 mo

## 2022-06-09 NOTE — Assessment & Plan Note (Signed)
F/b heme

## 2022-06-09 NOTE — Assessment & Plan Note (Signed)
Pt now treated, will repeat testosterone levels

## 2022-06-09 NOTE — Assessment & Plan Note (Signed)
Unmedicated was well controlled w/ diet/exercise previously Repeat fasting lipids

## 2022-06-13 LAB — LIPID PANEL
Chol/HDL Ratio: 4.9 ratio (ref 0.0–5.0)
HDL: 48 mg/dL (ref >39)
Triglycerides: 75 mg/dL (ref 0–149)
VLDL Cholesterol Cal: 13 mg/dL (ref 5–40)

## 2022-06-30 ENCOUNTER — Ambulatory Visit (INDEPENDENT_AMBULATORY_CARE_PROVIDER_SITE_OTHER): Payer: Medicare Other | Admitting: Gastroenterology

## 2022-06-30 ENCOUNTER — Encounter: Payer: Self-pay | Admitting: Gastroenterology

## 2022-06-30 VITALS — BP 144/87 | HR 98 | Temp 98.1°F | Ht 73.0 in | Wt 188.0 lb

## 2022-06-30 DIAGNOSIS — E611 Iron deficiency: Secondary | ICD-10-CM
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
## 2022-07-05 ENCOUNTER — Inpatient Hospital Stay: Payer: Medicare Other | Attending: Oncology

## 2022-07-05 ENCOUNTER — Encounter: Payer: Self-pay | Admitting: Oncology

## 2022-07-05 ENCOUNTER — Inpatient Hospital Stay (HOSPITAL_BASED_OUTPATIENT_CLINIC_OR_DEPARTMENT_OTHER): Payer: Medicare Other | Admitting: Oncology

## 2022-07-05 ENCOUNTER — Other Ambulatory Visit: Payer: Self-pay

## 2022-07-05 VITALS — BP 136/95 | HR 69 | Temp 98.6°F | Resp 18 | Wt 192.6 lb

## 2022-07-05 DIAGNOSIS — D471 Chronic myeloproliferative disease: Secondary | ICD-10-CM

## 2022-07-05 DIAGNOSIS — D75839 Thrombocytosis, unspecified: Secondary | ICD-10-CM | POA: Insufficient documentation

## 2022-07-05 DIAGNOSIS — D45 Polycythemia vera: Secondary | ICD-10-CM | POA: Diagnosis not present

## 2022-07-05 DIAGNOSIS — Z9081 Acquired absence of spleen: Secondary | ICD-10-CM | POA: Insufficient documentation

## 2022-07-05 DIAGNOSIS — E611 Iron deficiency: Secondary | ICD-10-CM

## 2022-07-05 LAB — IRON AND TIBC
Iron: 52 ug/dL (ref 45–182)
Saturation Ratios: 13 % — ABNORMAL LOW (ref 17.9–39.5)
TIBC: 399 ug/dL (ref 250–450)
UIBC: 347 ug/dL

## 2022-07-05 LAB — CBC WITH DIFFERENTIAL (CANCER CENTER ONLY)
Abs Immature Granulocytes: 0.07 10*3/uL (ref 0.00–0.07)
Basophils Absolute: 0.2 10*3/uL — ABNORMAL HIGH (ref 0.0–0.1)
Basophils Relative: 2 %
Eosinophils Absolute: 0.1 10*3/uL (ref 0.0–0.5)
Eosinophils Relative: 1 %
HCT: 48.2 % (ref 39.0–52.0)
Hemoglobin: 15.7 g/dL (ref 13.0–17.0)
Immature Granulocytes: 1 %
Lymphocytes Relative: 14 %
Lymphs Abs: 1.6 10*3/uL (ref 0.7–4.0)
MCH: 27.6 pg (ref 26.0–34.0)
MCHC: 32.6 g/dL (ref 30.0–36.0)
MCV: 84.9 fL (ref 80.0–100.0)
Monocytes Absolute: 1 10*3/uL (ref 0.1–1.0)
Monocytes Relative: 9 %
Neutro Abs: 8.2 10*3/uL — ABNORMAL HIGH (ref 1.7–7.7)
Neutrophils Relative %: 73 %
Platelet Count: 629 10*3/uL — ABNORMAL HIGH (ref 150–400)
RBC: 5.68 MIL/uL (ref 4.22–5.81)
RDW: 20.9 % — ABNORMAL HIGH (ref 11.5–15.5)
WBC Count: 11.1 10*3/uL — ABNORMAL HIGH (ref 4.0–10.5)
nRBC: 0 % (ref 0.0–0.2)

## 2022-07-05 LAB — CMP (CANCER CENTER ONLY)
ALT: 13 U/L (ref 0–44)
AST: 18 U/L (ref 15–41)
Albumin: 4.1 g/dL (ref 3.5–5.0)
Alkaline Phosphatase: 69 U/L (ref 38–126)
Anion gap: 6 (ref 5–15)
BUN: 16 mg/dL (ref 6–20)
CO2: 27 mmol/L (ref 22–32)
Calcium: 8.4 mg/dL — ABNORMAL LOW (ref 8.9–10.3)
Chloride: 103 mmol/L (ref 98–111)
Creatinine: 0.89 mg/dL (ref 0.61–1.24)
GFR, Estimated: >60 mL/min (ref >60)
Glucose, Bld: 88 mg/dL (ref 70–99)
Potassium: 3.5 mmol/L (ref 3.5–5.1)
Sodium: 136 mmol/L (ref 135–145)
Total Bilirubin: 1.5 mg/dL — ABNORMAL HIGH (ref 0.3–1.2)
Total Protein: 7.3 g/dL (ref 6.5–8.1)

## 2022-07-05 LAB — VITAMIN B12: Vitamin B-12: 521 pg/mL (ref 180–914)

## 2022-07-05 LAB — RETIC PANEL
Immature Retic Fract: 12 % (ref 2.3–15.9)
RBC.: 5.64 MIL/uL (ref 4.22–5.81)
Retic Count, Absolute: 57.5 10*3/uL (ref 19.0–186.0)
Retic Ct Pct: 1 % (ref 0.4–3.1)
Reticulocyte Hemoglobin: 29.7 pg (ref >27.9)

## 2022-07-05 LAB — FERRITIN: Ferritin: 6 ng/mL — ABNORMAL LOW (ref 24–336)

## 2022-07-05 MED ORDER — HYDROXYUREA 500 MG PO CAPS: 500.0000 mg | ORAL_CAPSULE | ORAL | 1 refills | Status: DC

## 2022-07-05 MED ORDER — HYDROXYUREA 500 MG PO CAPS: 1000.0000 mg | ORAL_CAPSULE | ORAL | 1 refills | Status: DC
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
## 2022-07-05 NOTE — Assessment & Plan Note (Signed)
Chronic persistent iron deficiency.  His MPN may contributes to iron deficiency, however his Hemoglobin has been well controlled, no erythrocytosis.  He was seen by GI and was reluctant in proceed with endoscopy for evaluation of any GI blood loss.

## 2022-07-05 NOTE — Assessment & Plan Note (Addendum)
[name] 2 positive MPN, age<65, no previous thrombosis events. No pre-treatment bone marrow biopsy in the past. 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  Labs are reviewed and discussed with patient. Lab Results  Component Value Date   HGB 15.7 07/05/2022   PLT 629 (H) 07/05/2022    Continue Hydroxyurea to 1000 mg daily. We discussed in details and all questions were answered to his satisfaction.

## 2022-07-05 NOTE — Assessment & Plan Note (Signed)
Thrombocytosis is likely multifactorial, secondary to underlying iron deficiency, s/p splenectomy, or secondary to myeloproliferative disease. Stable.

## 2022-07-06 LAB — ANTI-PARIETAL ANTIBODY: Parietal Cell Antibody-IgG: 113.7 Units — ABNORMAL HIGH (ref 0.0–20.0)

## 2022-07-06 LAB — INTRINSIC FACTOR ANTIBODIES: Intrinsic Factor: 1.4 AU/mL — ABNORMAL HIGH (ref 0.0–1.1)

## 2022-07-07 LAB — CELIAC PANEL 10
Antigliadin Abs, IgA: 14 units (ref 0–19)
Endomysial Ab, IgA: NEGATIVE
Gliadin IgG: 6 units (ref 0–19)
IgA: 483 mg/dL — ABNORMAL HIGH (ref 90–386)
Tissue Transglut Ab: 7 U/mL — ABNORMAL HIGH (ref 0–5)
Tissue Transglutaminase Ab, IgA: <2 U/mL (ref 0–3)

## 2022-08-09 ENCOUNTER — Telehealth: Payer: Self-pay

## 2022-08-09 NOTE — Telephone Encounter (Signed)
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  Recommend EGD for further evaluation if patient is agreeable  RV

## 2022-08-09 NOTE — Telephone Encounter (Signed)
Called and left a message for call back  

## 2022-08-10 ENCOUNTER — Other Ambulatory Visit: Payer: Self-pay

## 2022-08-10 DIAGNOSIS — E611 Iron deficiency: Secondary | ICD-10-CM

## 2022-08-10 NOTE — Telephone Encounter (Signed)
Called patient and patient verbalized understanding of results. He did want [place] to sent the results to his mychart account which I did by a mychart message. Schedule EGD for 09/15/2022. Went over instructions, mailed them, and sent to [employer]

## 2022-08-20 ENCOUNTER — Other Ambulatory Visit: Payer: Self-pay | Admitting: Physician Assistant

## 2022-08-20 DIAGNOSIS — I10 Essential (primary) hypertension: Secondary | ICD-10-CM

## 2022-09-07 ENCOUNTER — Inpatient Hospital Stay: Payer: Medicare Other | Attending: Oncology

## 2022-09-07 ENCOUNTER — Encounter: Payer: Self-pay | Admitting: Oncology

## 2022-09-07 ENCOUNTER — Inpatient Hospital Stay (HOSPITAL_BASED_OUTPATIENT_CLINIC_OR_DEPARTMENT_OTHER): Payer: Medicare Other | Admitting: Oncology

## 2022-09-07 VITALS — BP 115/84 | HR 62 | Temp 96.1°F | Resp 18 | Wt 192.7 lb

## 2022-09-07 DIAGNOSIS — D75839 Thrombocytosis, unspecified: Secondary | ICD-10-CM

## 2022-09-07 DIAGNOSIS — Z808 Family history of malignant neoplasm of other organs or systems: Secondary | ICD-10-CM | POA: Diagnosis not present

## 2022-09-07 DIAGNOSIS — Z9081 Acquired absence of spleen: Secondary | ICD-10-CM | POA: Diagnosis not present

## 2022-09-07 DIAGNOSIS — E611 Iron deficiency: Secondary | ICD-10-CM | POA: Diagnosis not present

## 2022-09-07 DIAGNOSIS — D471 Chronic myeloproliferative disease: Secondary | ICD-10-CM

## 2022-09-07 DIAGNOSIS — D45 Polycythemia vera: Secondary | ICD-10-CM | POA: Insufficient documentation

## 2022-09-07 LAB — CMP (CANCER CENTER ONLY)
ALT: 15 U/L (ref 0–44)
AST: 19 U/L (ref 15–41)
Albumin: 4 g/dL (ref 3.5–5.0)
Alkaline Phosphatase: 71 U/L (ref 38–126)
Anion gap: 5 (ref 5–15)
BUN: 16 mg/dL (ref 6–20)
CO2: 26 mmol/L (ref 22–32)
Calcium: 8.5 mg/dL — ABNORMAL LOW (ref 8.9–10.3)
Chloride: 103 mmol/L (ref 98–111)
Creatinine: 0.89 mg/dL (ref 0.61–1.24)
GFR, Estimated: >60 mL/min (ref >60)
Glucose, Bld: 89 mg/dL (ref 70–99)
Potassium: 3.7 mmol/L (ref 3.5–5.1)
Sodium: 134 mmol/L — ABNORMAL LOW (ref 135–145)
Total Bilirubin: 1.3 mg/dL — ABNORMAL HIGH (ref 0.3–1.2)
Total Protein: 7.5 g/dL (ref 6.5–8.1)

## 2022-09-07 LAB — CBC WITH DIFFERENTIAL (CANCER CENTER ONLY)
Abs Immature Granulocytes: 0.05 10*3/uL (ref 0.00–0.07)
Basophils Absolute: 0.2 10*3/uL — ABNORMAL HIGH (ref 0.0–0.1)
Basophils Relative: 2 %
Eosinophils Absolute: 0.1 10*3/uL (ref 0.0–0.5)
Eosinophils Relative: 1 %
HCT: 49.6 % (ref 39.0–52.0)
Hemoglobin: 16 g/dL (ref 13.0–17.0)
Immature Granulocytes: 1 %
Lymphocytes Relative: 12 %
Lymphs Abs: 1.3 10*3/uL (ref 0.7–4.0)
MCH: 28.7 pg (ref 26.0–34.0)
MCHC: 32.3 g/dL (ref 30.0–36.0)
MCV: 89 fL (ref 80.0–100.0)
Monocytes Absolute: 0.9 10*3/uL (ref 0.1–1.0)
Monocytes Relative: 8 %
Neutro Abs: 8.4 10*3/uL — ABNORMAL HIGH (ref 1.7–7.7)
Neutrophils Relative %: 76 %
Platelet Count: 586 10*3/uL — ABNORMAL HIGH (ref 150–400)
RBC: 5.57 MIL/uL (ref 4.22–5.81)
RDW: 21.2 % — ABNORMAL HIGH (ref 11.5–15.5)
WBC Count: 10.9 10*3/uL — ABNORMAL HIGH (ref 4.0–10.5)
nRBC: 0 % (ref 0.0–0.2)

## 2022-09-07 LAB — IRON AND TIBC
Iron: 34 ug/dL — ABNORMAL LOW (ref 45–182)
Saturation Ratios: 8 % — ABNORMAL LOW (ref 17.9–39.5)
TIBC: 414 ug/dL (ref 250–450)
UIBC: 380 ug/dL

## 2022-09-07 LAB — FERRITIN: Ferritin: 7 ng/mL — ABNORMAL LOW (ref 24–336)

## 2022-09-07 MED ORDER — HYDROXYUREA 500 MG PO CAPS: 500.0000 mg | ORAL_CAPSULE | ORAL | 1 refills | Status: DC

## 2022-09-07 NOTE — Assessment & Plan Note (Addendum)
[name] 2 positive MPN, age<65, no previous thrombosis events. No pre-treatment bone marrow biopsy in the past. 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  Labs are reviewed and discussed with patient. Lab Results  Component Value Date   HGB 16.0 09/07/2022   PLT 586 (H) 09/07/2022    Continue Hydroxyurea, since he has been on 1000 mg 3 times per week, and 500mg  on rest of the days, platelet is stable and slightly improved. Recommend him to continue this regimen.  We discussed in details and all questions were answered to his satisfaction.

## 2022-09-07 NOTE — Assessment & Plan Note (Signed)
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 Stable.

## 2022-09-07 NOTE — Assessment & Plan Note (Addendum)
Chronic persistent iron deficiency.  His MPN may contributes to iron deficiency, however his Hemoglobin has been well controlled, no erythrocytosis.  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  Follow up with GI
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
## 2022-09-14 ENCOUNTER — Encounter: Payer: Self-pay | Admitting: Gastroenterology

## 2022-09-15 ENCOUNTER — Ambulatory Visit: Payer: Medicare Other | Admitting: Certified Registered"

## 2022-09-15 ENCOUNTER — Ambulatory Visit
Admission: RE | Admit: 2022-09-15 | Discharge: 2022-09-15 | Disposition: A | Discharge status: Home / Self Care | Payer: Medicare Other | Attending: Gastroenterology | Admitting: Gastroenterology

## 2022-09-15 ENCOUNTER — Other Ambulatory Visit: Payer: Self-pay

## 2022-09-15 ENCOUNTER — Encounter
Admission: RE | Disposition: A | Discharge status: Home / Self Care | Payer: Self-pay | Source: Home / Self Care | Attending: Gastroenterology

## 2022-09-15 ENCOUNTER — Encounter: Payer: Self-pay | Admitting: Gastroenterology

## 2022-09-15 DIAGNOSIS — D510 Vitamin B12 deficiency anemia due to intrinsic factor deficiency: Secondary | ICD-10-CM | POA: Diagnosis present

## 2022-09-15 DIAGNOSIS — K317 Polyp of stomach and duodenum: Secondary | ICD-10-CM

## 2022-09-15 DIAGNOSIS — K2940 Chronic atrophic gastritis without bleeding: Secondary | ICD-10-CM

## 2022-09-15 DIAGNOSIS — K3189 Other diseases of stomach and duodenum: Secondary | ICD-10-CM | POA: Insufficient documentation

## 2022-09-15 DIAGNOSIS — K2950 Unspecified chronic gastritis without bleeding: Secondary | ICD-10-CM | POA: Insufficient documentation

## 2022-09-15 DIAGNOSIS — D45 Polycythemia vera: Secondary | ICD-10-CM | POA: Diagnosis not present

## 2022-09-15 DIAGNOSIS — I10 Essential (primary) hypertension: Secondary | ICD-10-CM | POA: Diagnosis not present

## 2022-09-15 DIAGNOSIS — F32A Depression, unspecified: Secondary | ICD-10-CM | POA: Diagnosis not present

## 2022-09-15 DIAGNOSIS — E611 Iron deficiency: Secondary | ICD-10-CM

## 2022-09-15 DIAGNOSIS — R894 Abnormal immunological findings in specimens from other organs, systems and tissues: Secondary | ICD-10-CM | POA: Diagnosis not present

## 2022-09-15 DIAGNOSIS — K31A12 Gastric intestinal metaplasia without dysplasia, involving the body (corpus): Secondary | ICD-10-CM | POA: Insufficient documentation

## 2022-09-15 HISTORY — PX: HEMOSTASIS CLIP PLACEMENT: SHX6857

## 2022-09-15 HISTORY — PX: POLYPECTOMY: SHX5525

## 2022-09-15 HISTORY — PX: BIOPSY: SHX5522

## 2022-09-15 HISTORY — PX: ESOPHAGOGASTRODUODENOSCOPY (EGD) WITH PROPOFOL: SHX5813

## 2022-09-15 SURGERY — ESOPHAGOGASTRODUODENOSCOPY (EGD) WITH PROPOFOL
Anesthesia: General

## 2022-09-15 MED ORDER — PROPOFOL 10 MG/ML IV BOLUS
INTRAVENOUS | Status: AC
  Filled 2022-09-15: qty 20

## 2022-09-15 MED ORDER — PROPOFOL 10 MG/ML IV BOLUS
INTRAVENOUS | Status: DC | PRN
Start: 2022-09-15 — End: 2022-09-15
  Administered 2022-09-15: 120 mg via INTRAVENOUS
  Administered 2022-09-15: 140 ug/kg/min via INTRAVENOUS

## 2022-09-15 MED ORDER — GLYCOPYRROLATE 0.2 MG/ML IJ SOLN
INTRAMUSCULAR | Status: DC | PRN
  Administered 2022-09-15: .1 mg via INTRAVENOUS

## 2022-09-15 MED ORDER — SODIUM CHLORIDE 0.9 % IV SOLN: INTRAVENOUS | Status: DC

## 2022-09-15 MED ORDER — LIDOCAINE HCL (CARDIAC) PF 100 MG/5ML IV SOSY
PREFILLED_SYRINGE | INTRAVENOUS | Status: DC | PRN
  Administered 2022-09-15: 100 mg via INTRAVENOUS

## 2022-09-15 MED ORDER — GLYCOPYRROLATE 0.2 MG/ML IJ SOLN
INTRAMUSCULAR | Status: AC
  Filled 2022-09-15: qty 2

## 2022-09-15 MED ORDER — PROPOFOL 10 MG/ML IV BOLUS
INTRAVENOUS | Status: AC
  Filled 2022-09-15: qty 40

## 2022-09-15 MED ORDER — DEXMEDETOMIDINE HCL IN NACL 80 MCG/20ML IV SOLN
INTRAVENOUS | Status: AC
  Filled 2022-09-15: qty 20

## 2022-09-15 MED ORDER — LIDOCAINE HCL (PF) 2 % IJ SOLN
INTRAMUSCULAR | Status: AC
  Filled 2022-09-15: qty 5

## 2022-09-15 MED ORDER — DEXMEDETOMIDINE HCL IN NACL 80 MCG/20ML IV SOLN
INTRAVENOUS | Status: DC | PRN
  Administered 2022-09-15 (×2): 4 ug via INTRAVENOUS

## 2022-09-15 NOTE — Op Note (Signed)
[facility] Gastroenterology Patient Name: Kenneth [name] Procedure Date: 09/15/2022 9:54 AM MRN: [id number] Account #: [id number] Date of Birth: 1960-08-10 Admit Type: Outpatient Age: 62 Room: [facility] ENDO ROOM 4 Gender: Male Note Status: Finalized Instrument Name: Upper Endoscope [id number] Procedure:             Upper GI endoscopy Indications:           Pernicious anemia, Positive celiac serologies Providers:             Toney Reil MD, MD Referring MD:          No Local Md, MD (Referring MD) Medicines:             General Anesthesia Complications:         No immediate complications. Estimated blood loss: None. Procedure:             Pre-Anesthesia Assessment:                        - Prior to the procedure, a History and Physical was                         performed, and patient medications and allergies were                         reviewed. The patient is competent. The risks and                         benefits of the procedure and the sedation options and                         risks were discussed with the patient. All questions                         were answered and informed consent was obtained.                         Patient identification and proposed procedure were                         verified by the physician, the nurse, the                         anesthesiologist, the anesthetist and the technician                         in the pre-procedure area in the procedure room in the                         endoscopy suite. Mental Status Examination: alert and                         oriented. Airway Examination: normal oropharyngeal                         airway and neck mobility. Respiratory Examination:                         clear to auscultation. CV Examination: normal.  Prophylactic Antibiotics: The patient does not require                         prophylactic antibiotics. Prior Anticoagulants: The                          patient has taken no anticoagulant or antiplatelet                         agents. ASA Grade Assessment: II - A patient with mild                         systemic disease. After reviewing the risks and                         benefits, the patient was deemed in satisfactory                         condition to undergo the procedure. The anesthesia                         plan was to use general anesthesia. Immediately prior                         to administration of medications, the patient was                         re-assessed for adequacy to receive sedatives. The                         heart rate, respiratory rate, oxygen saturations,                         blood pressure, adequacy of pulmonary ventilation, and                         response to care were monitored throughout the                         procedure. The physical status of the patient was                         re-assessed after the procedure.                        After obtaining informed consent, the endoscope was                         passed under direct vision. Throughout the procedure,                         the patient's blood pressure, pulse, and oxygen                         saturations were monitored continuously. The Endoscope                         was introduced through the mouth, and advanced to the  second part of duodenum. The upper GI endoscopy was                         accomplished without difficulty. The patient tolerated                         the procedure well. Findings:      Diffuse erythematous mucosa without active bleeding and with no stigmata       of bleeding was found in the duodenal bulb and in the second portion of       the duodenum. Biopsies for histology were taken with a cold forceps for       evaluation of celiac disease.      A single 12 mm sessile polyp with no bleeding and no stigmata of recent       bleeding was found in the cardia. The  polyp was removed with a hot       snare. Resection and retrieval were complete. To prevent bleeding after       the polypectomy, one hemostatic clip was successfully placed (MR safe).       Clip manufacturer: [employer]. There was no bleeding during, or       at the end, of the procedure. Estimated blood loss: none.      Diffuse atrophic mucosa was found in the entire examined stomach.       Biopsies were taken with a cold forceps for histology.      The gastroesophageal junction and examined esophagus were normal. Impression:            - Erythematous duodenopathy. Biopsied.                        - A single gastric polyp. Resected and retrieved. Clip                         manufacturer: [employer]. Clip (MR safe) was                         placed.                        - Gastric mucosal atrophy. Biopsied.                        - Normal gastroesophageal junction and esophagus. Recommendation:        - Await pathology results.                        - Discharge patient to home (with escort).                        - Resume previous diet today.                        - Continue present medications. Procedure Code(s):     --- Professional ---                        [phone], Esophagogastroduodenoscopy, flexible,                         transoral; with removal of tumor(s), polyp(s), or  other lesion(s) by snare technique                        [id number], 59, Esophagogastroduodenoscopy, flexible,                         transoral; with biopsy, single or multiple Diagnosis Code(s):     --- Professional ---                        K31.89, Other diseases of stomach and duodenum                        K31.7, Polyp of stomach and duodenum                        D51.0, Vitamin B12 deficiency anemia due to intrinsic                         factor deficiency                        R76.8, Other specified abnormal immunological findings                         in  serum CPT copyright 2022 American Medical Association. All rights reserved. The codes documented in this report are preliminary and upon coder review may  be revised to meet current compliance requirements. Dr. Libby Maw Toney Reil MD, MD 09/15/2022 10:34:18 AM This report has been signed electronically. Number of Addenda: 0 Note Initiated On: 09/15/2022 9:54 AM Estimated Blood Loss:  Estimated blood loss: none.      [facility]

## 2022-09-15 NOTE — Transfer of Care (Signed)
Immediate Anesthesia Transfer of Care Note  Patient: Kenneth [name]  Procedure(s) Performed: ESOPHAGOGASTRODUODENOSCOPY (EGD) WITH PROPOFOL BIOPSY HOT HEMOSTASIS (ARGON PLASMA COAGULATION/BICAP) POLYPECTOMY HEMOSTASIS CLIP PLACEMENT  Patient Location: PACU  Anesthesia Type:General  Level of Consciousness: drowsy  Airway & Oxygen Therapy: Patient Spontanous Breathing  Post-op Assessment: Report given to RN and Post -op Vital signs reviewed and stable  Post vital signs: Reviewed and stable  Last Vitals:  Vitals Value Taken Time  BP 109/77 09/15/22 1035  Temp 36.4 C 09/15/22 1034  Pulse 76 09/15/22 1036  Resp 11 09/15/22 1037  SpO2 95 % 09/15/22 1036  Vitals shown include unfiled device data.  Last Pain:  Vitals:   09/15/22 1034  TempSrc: Temporal  PainSc: Asleep         Complications: No notable events documented.

## 2022-09-15 NOTE — Anesthesia Postprocedure Evaluation (Signed)
Anesthesia Post Note  Patient: Kenneth [name]  Procedure(s) Performed: ESOPHAGOGASTRODUODENOSCOPY (EGD) WITH PROPOFOL BIOPSY HOT HEMOSTASIS (ARGON PLASMA COAGULATION/BICAP) POLYPECTOMY HEMOSTASIS CLIP PLACEMENT  Patient location during evaluation: PACU Anesthesia Type: General Level of consciousness: awake and alert, oriented and patient cooperative Pain management: pain level controlled Vital Signs Assessment: post-procedure vital signs reviewed and stable Respiratory status: spontaneous breathing, nonlabored ventilation and respiratory function stable Cardiovascular status: blood pressure returned to baseline and stable Postop Assessment: adequate PO intake Anesthetic complications: no   No notable events documented.   Last Vitals:  Vitals:   09/15/22 1044 09/15/22 1054  BP: 109/85 (!) 115/91  Pulse: 90 79  Resp: 20 12  Temp:    SpO2: 94% 94%    Last Pain:  Vitals:   09/15/22 1054  TempSrc:   PainSc: 0-No pain                 Reed Breech
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
## 2022-09-16 ENCOUNTER — Encounter: Payer: Self-pay | Admitting: Gastroenterology

## 2022-09-21 ENCOUNTER — Telehealth: Payer: Self-pay

## 2022-09-21 DIAGNOSIS — K2940 Chronic atrophic gastritis without bleeding: Secondary | ICD-10-CM

## 2022-09-21 NOTE — Telephone Encounter (Signed)
Patient verbalized understanding of results. He states that he will try to get the blood work done this week

## 2022-09-21 NOTE — Telephone Encounter (Signed)
-----   Message from [facility] sent at 09/21/2022  8:33 AM EDT ----- Pathology results from EGD shows he may have autoimmune gastritis, recommend to check fasting serum gastrin levels  RV

## 2022-09-26 LAB — GASTRIN: Gastrin: 457 pg/mL — ABNORMAL HIGH (ref 0–115)

## 2022-09-27 ENCOUNTER — Telehealth: Payer: Self-pay

## 2022-09-27 NOTE — Telephone Encounter (Signed)
Patient verbalized understanding of results  

## 2022-09-27 NOTE — Telephone Encounter (Signed)
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  No further workup is indicated from GI standpoint  RV

## 2022-09-27 NOTE — Telephone Encounter (Signed)
Patient verbalized understanding of results. He did ask if there is any labs he needs to do at a later time or is there any medication to take for the Autoimmune atrophic gastritis. He just is wanting to know the treatment plan for this and is what needing to take place

## 2022-09-27 NOTE — Telephone Encounter (Signed)
There is no treatment for autoimmune atrophic gastritis, it is an autoimmune condition.  This condition also does not predispose to gastric cancer.  There is no monitoring for this condition either other than monitoring for anemia, iron and B12 levels every 6 months or so  RV

## 2022-11-24 ENCOUNTER — Other Ambulatory Visit: Payer: Self-pay | Admitting: Physician Assistant

## 2022-11-24 DIAGNOSIS — I10 Essential (primary) hypertension: Secondary | ICD-10-CM

## 2022-12-07 ENCOUNTER — Inpatient Hospital Stay: Payer: Medicare Other | Attending: Oncology

## 2022-12-07 ENCOUNTER — Inpatient Hospital Stay (HOSPITAL_BASED_OUTPATIENT_CLINIC_OR_DEPARTMENT_OTHER): Payer: Medicare Other | Admitting: Oncology

## 2022-12-07 ENCOUNTER — Encounter: Payer: Self-pay | Admitting: Oncology

## 2022-12-07 VITALS — BP 126/94 | HR 88 | Temp 97.0°F | Resp 18 | Wt 200.6 lb

## 2022-12-07 DIAGNOSIS — Z808 Family history of malignant neoplasm of other organs or systems: Secondary | ICD-10-CM | POA: Insufficient documentation

## 2022-12-07 DIAGNOSIS — E611 Iron deficiency: Secondary | ICD-10-CM | POA: Diagnosis not present

## 2022-12-07 DIAGNOSIS — D45 Polycythemia vera: Secondary | ICD-10-CM | POA: Insufficient documentation

## 2022-12-07 DIAGNOSIS — Z9081 Acquired absence of spleen: Secondary | ICD-10-CM | POA: Diagnosis not present

## 2022-12-07 DIAGNOSIS — D75839 Thrombocytosis, unspecified: Secondary | ICD-10-CM | POA: Diagnosis not present

## 2022-12-07 DIAGNOSIS — D471 Chronic myeloproliferative disease: Secondary | ICD-10-CM

## 2022-12-07 LAB — CBC WITH DIFFERENTIAL (CANCER CENTER ONLY)
Abs Immature Granulocytes: 0.08 10*3/uL — ABNORMAL HIGH (ref 0.00–0.07)
Basophils Absolute: 0.3 10*3/uL — ABNORMAL HIGH (ref 0.0–0.1)
Basophils Relative: 3 %
Eosinophils Absolute: 0.2 10*3/uL (ref 0.0–0.5)
Eosinophils Relative: 1 %
HCT: 45.7 % (ref 39.0–52.0)
Hemoglobin: 14.4 g/dL (ref 13.0–17.0)
Immature Granulocytes: 1 %
Lymphocytes Relative: 13 %
Lymphs Abs: 1.8 10*3/uL (ref 0.7–4.0)
MCH: 26 pg (ref 26.0–34.0)
MCHC: 31.5 g/dL (ref 30.0–36.0)
MCV: 82.6 fL (ref 80.0–100.0)
Monocytes Absolute: 1.3 10*3/uL — ABNORMAL HIGH (ref 0.1–1.0)
Monocytes Relative: 10 %
Neutro Abs: 9.9 10*3/uL — ABNORMAL HIGH (ref 1.7–7.7)
Neutrophils Relative %: 72 %
Platelet Count: 900 10*3/uL — ABNORMAL HIGH (ref 150–400)
RBC: 5.53 MIL/uL (ref 4.22–5.81)
RDW: 19 % — ABNORMAL HIGH (ref 11.5–15.5)
WBC Count: 13.6 10*3/uL — ABNORMAL HIGH (ref 4.0–10.5)
nRBC: 0.1 % (ref 0.0–0.2)

## 2022-12-07 LAB — CMP (CANCER CENTER ONLY)
ALT: 16 U/L (ref 0–44)
AST: 20 U/L (ref 15–41)
Albumin: 3.9 g/dL (ref 3.5–5.0)
Alkaline Phosphatase: 79 U/L (ref 38–126)
Anion gap: 9 (ref 5–15)
BUN: 20 mg/dL (ref 6–20)
CO2: 27 mmol/L (ref 22–32)
Calcium: 8.9 mg/dL (ref 8.9–10.3)
Chloride: 103 mmol/L (ref 98–111)
Creatinine: 0.94 mg/dL (ref 0.61–1.24)
GFR, Estimated: >60 mL/min (ref >60)
Glucose, Bld: 97 mg/dL (ref 70–99)
Potassium: 4.1 mmol/L (ref 3.5–5.1)
Sodium: 139 mmol/L (ref 135–145)
Total Bilirubin: 0.8 mg/dL (ref <1.2)
Total Protein: 7.5 g/dL (ref 6.5–8.1)

## 2022-12-07 MED ORDER — HYDROXYUREA 500 MG PO CAPS: 500.0000 mg | ORAL_CAPSULE | ORAL | 3 refills | Status: DC

## 2022-12-07 NOTE — Assessment & Plan Note (Signed)
Chronic persistent iron deficiency.  His MPN may contributes to iron deficiency, however his Hemoglobin has been well controlled, no erythrocytosis.  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  Follow up with GI
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
## 2022-12-07 NOTE — Assessment & Plan Note (Addendum)
[name] 2 positive MPN, age<65, no previous thrombosis events. No pre-treatment bone marrow biopsy in the past. 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  Labs are reviewed and discussed with patient. Lab Results  Component Value Date   HGB 14.4 12/07/2022   PLT 900 (H) 12/07/2022    Continue Hydroxyurea, increase of platelet count is likely due to not complaint with current regimen.  Recommend him to take 1000 mg 3 times per week, and 500mg  on rest of the days, platelet is stable and slightly improved. Recommend him to continue this regimen.  We discussed in details and all questions were answered to his satisfaction.

## 2022-12-07 NOTE — Assessment & Plan Note (Signed)
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 Stable.

## 2022-12-12 LAB — JAK2 V617F RFX CALR/MPL/E12-15: JAK2 V617F %: 72.78 %

## 2022-12-14 ENCOUNTER — Other Ambulatory Visit: Payer: Self-pay | Admitting: Oncology

## 2023-02-08 ENCOUNTER — Encounter: Payer: Self-pay | Admitting: Oncology

## 2023-02-08 ENCOUNTER — Inpatient Hospital Stay: Payer: Medicare Other | Attending: Oncology

## 2023-02-08 ENCOUNTER — Inpatient Hospital Stay (HOSPITAL_BASED_OUTPATIENT_CLINIC_OR_DEPARTMENT_OTHER): Payer: Medicare Other | Admitting: Oncology

## 2023-02-08 VITALS — BP 120/88 | HR 73 | Temp 98.1°F | Resp 18 | Wt 198.7 lb

## 2023-02-08 DIAGNOSIS — Z9081 Acquired absence of spleen: Secondary | ICD-10-CM | POA: Diagnosis not present

## 2023-02-08 DIAGNOSIS — D75839 Thrombocytosis, unspecified: Secondary | ICD-10-CM

## 2023-02-08 DIAGNOSIS — E611 Iron deficiency: Secondary | ICD-10-CM | POA: Insufficient documentation

## 2023-02-08 DIAGNOSIS — D471 Chronic myeloproliferative disease: Secondary | ICD-10-CM | POA: Diagnosis not present

## 2023-02-08 DIAGNOSIS — Z808 Family history of malignant neoplasm of other organs or systems: Secondary | ICD-10-CM | POA: Insufficient documentation

## 2023-02-08 DIAGNOSIS — D45 Polycythemia vera: Secondary | ICD-10-CM | POA: Insufficient documentation

## 2023-02-08 LAB — CMP (CANCER CENTER ONLY)
ALT: 17 U/L (ref 0–44)
AST: 21 U/L (ref 15–41)
Albumin: 3.9 g/dL (ref 3.5–5.0)
Alkaline Phosphatase: 82 U/L (ref 38–126)
Anion gap: 7 (ref 5–15)
BUN: 20 mg/dL (ref 6–20)
CO2: 27 mmol/L (ref 22–32)
Calcium: 9.1 mg/dL (ref 8.9–10.3)
Chloride: 104 mmol/L (ref 98–111)
Creatinine: 0.85 mg/dL (ref 0.61–1.24)
GFR, Estimated: >60 mL/min (ref >60)
Glucose, Bld: 101 mg/dL — ABNORMAL HIGH (ref 70–99)
Potassium: 3.9 mmol/L (ref 3.5–5.1)
Sodium: 138 mmol/L (ref 135–145)
Total Bilirubin: 1.1 mg/dL (ref 0.0–1.2)
Total Protein: 7.5 g/dL (ref 6.5–8.1)

## 2023-02-08 LAB — CBC WITH DIFFERENTIAL (CANCER CENTER ONLY)
Abs Immature Granulocytes: 0.11 10*3/uL — ABNORMAL HIGH (ref 0.00–0.07)
Basophils Absolute: 0.3 10*3/uL — ABNORMAL HIGH (ref 0.0–0.1)
Basophils Relative: 2 %
Eosinophils Absolute: 0.2 10*3/uL (ref 0.0–0.5)
Eosinophils Relative: 1 %
HCT: 45.6 % (ref 39.0–52.0)
Hemoglobin: 14.4 g/dL (ref 13.0–17.0)
Immature Granulocytes: 1 %
Lymphocytes Relative: 10 %
Lymphs Abs: 1.8 10*3/uL (ref 0.7–4.0)
MCH: 25.4 pg — ABNORMAL LOW (ref 26.0–34.0)
MCHC: 31.6 g/dL (ref 30.0–36.0)
MCV: 80.3 fL (ref 80.0–100.0)
Monocytes Absolute: 1.2 10*3/uL — ABNORMAL HIGH (ref 0.1–1.0)
Monocytes Relative: 7 %
Neutro Abs: 13.6 10*3/uL — ABNORMAL HIGH (ref 1.7–7.7)
Neutrophils Relative %: 79 %
Platelet Count: 661 10*3/uL — ABNORMAL HIGH (ref 150–400)
RBC: 5.68 MIL/uL (ref 4.22–5.81)
RDW: 20.7 % — ABNORMAL HIGH (ref 11.5–15.5)
WBC Count: 17.2 10*3/uL — ABNORMAL HIGH (ref 4.0–10.5)
nRBC: 0.1 % (ref 0.0–0.2)

## 2023-02-08 NOTE — Assessment & Plan Note (Addendum)
[name] 2 positive MPN age<65, no previous thrombosis events. No pre-treatment bone marrow biopsy in the past. 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  Labs are reviewed and discussed with patient. Lab Results  Component Value Date   HGB 14.4 02/08/2023   PLT 661 (H) 02/08/2023    Continue Hydroxyurea,improvement of platelet counts since last visit due to slightly improved compliance.    Recommend him to take 1000 mg alternate with 500mg .  Patient is not compliant in taking Hydroxyurea due to concern of drowsiness/dizziness, GI toxicities.  Also discussed about switching to other options if he can not tolerate Hydroxyurea. Patient declined as he is concerned about side effects associated with other treatment options. . Refer to [facility] for second opinion.  We discussed in details and all questions were answered to his satisfaction.

## 2023-02-08 NOTE — Assessment & Plan Note (Signed)
Chronic persistent iron deficiency.  His MPN may contributes to iron deficiency, however his Hemoglobin has been stable, no erythrocytosis.  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  Follow up with GI

## 2023-02-08 NOTE — Assessment & Plan Note (Signed)
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
## 2023-02-08 NOTE — Progress Notes (Signed)
Pt here for follow up. No new concerns voiced.   

## 2023-04-02 ENCOUNTER — Encounter: Payer: Self-pay | Admitting: Family Medicine

## 2023-04-04 ENCOUNTER — Telehealth: Payer: Self-pay
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
## 2023-04-04 NOTE — Telephone Encounter (Signed)
 Reviewed

## 2023-04-07 ENCOUNTER — Telehealth: Payer: Self-pay

## 2023-04-07 ENCOUNTER — Inpatient Hospital Stay (HOSPITAL_BASED_OUTPATIENT_CLINIC_OR_DEPARTMENT_OTHER): Payer: Medicare Other | Admitting: Oncology

## 2023-04-07 ENCOUNTER — Inpatient Hospital Stay: Payer: Medicare Other | Attending: Oncology

## 2023-04-07 ENCOUNTER — Encounter: Payer: Self-pay | Admitting: Oncology

## 2023-04-07 VITALS — BP 137/97 | HR 76 | Temp 96.7°F | Resp 18 | Wt 199.0 lb

## 2023-04-07 DIAGNOSIS — Z9081 Acquired absence of spleen: Secondary | ICD-10-CM | POA: Diagnosis not present

## 2023-04-07 DIAGNOSIS — D75839 Thrombocytosis, unspecified: Secondary | ICD-10-CM

## 2023-04-07 DIAGNOSIS — D471 Chronic myeloproliferative disease: Secondary | ICD-10-CM | POA: Diagnosis not present

## 2023-04-07 DIAGNOSIS — E611 Iron deficiency: Secondary | ICD-10-CM | POA: Diagnosis not present

## 2023-04-07 DIAGNOSIS — D45 Polycythemia vera: Secondary | ICD-10-CM | POA: Insufficient documentation

## 2023-04-07 DIAGNOSIS — Z808 Family history of malignant neoplasm of other organs or systems: Secondary | ICD-10-CM | POA: Insufficient documentation

## 2023-04-07 LAB — CBC WITH DIFFERENTIAL (CANCER CENTER ONLY)
Abs Immature Granulocytes: 0.18 10*3/uL — ABNORMAL HIGH (ref 0.00–0.07)
Basophils Absolute: 0.4 10*3/uL — ABNORMAL HIGH (ref 0.0–0.1)
Basophils Relative: 2 %
Eosinophils Absolute: 0.3 10*3/uL (ref 0.0–0.5)
Eosinophils Relative: 2 %
HCT: 46.2 % (ref 39.0–52.0)
Hemoglobin: 14.3 g/dL (ref 13.0–17.0)
Immature Granulocytes: 1 %
Lymphocytes Relative: 11 %
Lymphs Abs: 1.8 10*3/uL (ref 0.7–4.0)
MCH: 24.8 pg — ABNORMAL LOW (ref 26.0–34.0)
MCHC: 31 g/dL (ref 30.0–36.0)
MCV: 80.2 fL (ref 80.0–100.0)
Monocytes Absolute: 1.2 10*3/uL — ABNORMAL HIGH (ref 0.1–1.0)
Monocytes Relative: 7 %
Neutro Abs: 13.3 10*3/uL — ABNORMAL HIGH (ref 1.7–7.7)
Neutrophils Relative %: 77 %
Platelet Count: 1158 10*3/uL (ref 150–400)
RBC: 5.76 MIL/uL (ref 4.22–5.81)
RDW: 21.3 % — ABNORMAL HIGH (ref 11.5–15.5)
Smear Review: INCREASED
WBC Count: 17 10*3/uL — ABNORMAL HIGH (ref 4.0–10.5)
nRBC: 0.2 % (ref 0.0–0.2)

## 2023-04-07 LAB — CMP (CANCER CENTER ONLY)
ALT: 13 U/L (ref 0–44)
AST: 19 U/L (ref 15–41)
Albumin: 4 g/dL (ref 3.5–5.0)
Alkaline Phosphatase: 103 U/L (ref 38–126)
Anion gap: 9 (ref 5–15)
BUN: 15 mg/dL (ref 6–20)
CO2: 25 mmol/L (ref 22–32)
Calcium: 8.9 mg/dL (ref 8.9–10.3)
Chloride: 105 mmol/L (ref 98–111)
Creatinine: 0.96 mg/dL (ref 0.61–1.24)
GFR, Estimated: >60 mL/min (ref >60)
Glucose, Bld: 95 mg/dL (ref 70–99)
Potassium: 4.1 mmol/L (ref 3.5–5.1)
Sodium: 139 mmol/L (ref 135–145)
Total Bilirubin: 0.9 mg/dL (ref 0.0–1.2)
Total Protein: 7.6 g/dL (ref 6.5–8.1)

## 2023-04-07 NOTE — Telephone Encounter (Signed)
 Per LOS on 3/20:   Bone marrow biopsy asap  1 week after biopsy - MD visit   Spoke with Marylu Lund and there is nothing available next week, but potentially will have a a cancellation on 3/24. Pt is available on this date. If pt unable to get bx done next week then he will need a CBC next week.   BM bx date pending

## 2023-04-07 NOTE — Assessment & Plan Note (Signed)
 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

## 2023-04-07 NOTE — Assessment & Plan Note (Signed)
 Chronic persistent iron deficiency.  His MPN may contributes to iron deficiency, however his Hemoglobin has been stable, no erythrocytosis.  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  Follow up with GI

## 2023-04-07 NOTE — Assessment & Plan Note (Addendum)
 [name] 2 positive MPN age<65, no previous thrombosis events. No pre-treatment bone marrow biopsy in the past. 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  Labs are reviewed and discussed with patient. Lab Results  Component Value Date   HGB 14.3 04/07/2023   PLT 1,158 (HH) 04/07/2023    Patient reports being compliant with hydroxyurea regimen 1000 mg alternate with 500mg  daily.  It is unclear why his platelet count has increased to 1158,000.  He denies any bleeding events. It is unclear if there is a reactive component from his infection 2 weeks ago.  He has not had a bone marrow biopsy for a few years.  Recommend a bone marrow biopsy evaluation.  He agrees with the plan. We discussed about possibility of paradoxical bleeding tendency after platelet count reaches 1 million.  Recommend patient to temporarily hold off on aspirin 81 mg and resume once platelet count drops below 1 million. We discussed in details and all questions were answered to his satisfaction.
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
## 2023-04-08 ENCOUNTER — Encounter: Payer: Self-pay | Admitting: Oncology

## 2023-04-08 ENCOUNTER — Telehealth: Payer: Self-pay

## 2023-04-08 ENCOUNTER — Other Ambulatory Visit: Payer: Self-pay | Admitting: Physician Assistant

## 2023-04-08 DIAGNOSIS — Z01818 Encounter for other preprocedural examination: Secondary | ICD-10-CM

## 2023-04-08 NOTE — Telephone Encounter (Signed)
 Pt scheudled for BM bx on 3/24 @ 8:30a, arrive 7:30a. Pt aware or appt details.   Please schedule MD (for bx results) on 3/31 @ 2:45. Pt aware of appt

## 2023-04-08 NOTE — Telephone Encounter (Signed)
 Referral faxed to [facility] malignant heme. Fax confirmation received.   Re: second opinion for Myeloproliferative disorder.   Ph: [phone] fax: [phone]
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
## 2023-04-11 ENCOUNTER — Other Ambulatory Visit: Payer: Self-pay | Admitting: Oncology

## 2023-04-11 ENCOUNTER — Ambulatory Visit
Admission: RE | Admit: 2023-04-11 | Discharge: 2023-04-11 | Disposition: A | Discharge status: Home / Self Care | Source: Ambulatory Visit | Attending: Oncology | Admitting: Oncology

## 2023-04-11 ENCOUNTER — Encounter: Payer: Self-pay | Admitting: Radiology

## 2023-04-11 ENCOUNTER — Other Ambulatory Visit: Payer: Self-pay

## 2023-04-11 DIAGNOSIS — D509 Iron deficiency anemia, unspecified: Secondary | ICD-10-CM | POA: Insufficient documentation

## 2023-04-11 DIAGNOSIS — D471 Chronic myeloproliferative disease: Secondary | ICD-10-CM | POA: Diagnosis present

## 2023-04-11 DIAGNOSIS — E611 Iron deficiency: Secondary | ICD-10-CM

## 2023-04-11 DIAGNOSIS — D72829 Elevated white blood cell count, unspecified: Secondary | ICD-10-CM | POA: Diagnosis not present

## 2023-04-11 DIAGNOSIS — D75839 Thrombocytosis, unspecified: Secondary | ICD-10-CM

## 2023-04-11 DIAGNOSIS — Z01818 Encounter for other preprocedural examination: Secondary | ICD-10-CM

## 2023-04-11 LAB — CBC WITH DIFFERENTIAL/PLATELET
Abs Immature Granulocytes: 0.1 10*3/uL — ABNORMAL HIGH (ref 0.00–0.07)
Basophils Absolute: 0.3 10*3/uL — ABNORMAL HIGH (ref 0.0–0.1)
Basophils Relative: 2 %
Eosinophils Absolute: 0.2 10*3/uL (ref 0.0–0.5)
Eosinophils Relative: 1 %
HCT: 46.2 % (ref 39.0–52.0)
Hemoglobin: 14.7 g/dL (ref 13.0–17.0)
Immature Granulocytes: 1 %
Lymphocytes Relative: 11 %
Lymphs Abs: 1.7 10*3/uL (ref 0.7–4.0)
MCH: 25 pg — ABNORMAL LOW (ref 26.0–34.0)
MCHC: 31.8 g/dL (ref 30.0–36.0)
MCV: 78.7 fL — ABNORMAL LOW (ref 80.0–100.0)
Monocytes Absolute: 1.1 10*3/uL — ABNORMAL HIGH (ref 0.1–1.0)
Monocytes Relative: 7 %
Neutro Abs: 12.1 10*3/uL — ABNORMAL HIGH (ref 1.7–7.7)
Neutrophils Relative %: 78 %
Platelets: 875 10*3/uL — ABNORMAL HIGH (ref 150–400)
RBC: 5.87 MIL/uL — ABNORMAL HIGH (ref 4.22–5.81)
RDW: 21.3 % — ABNORMAL HIGH (ref 11.5–15.5)
Smear Review: NORMAL
WBC: 15.5 10*3/uL — ABNORMAL HIGH (ref 4.0–10.5)
nRBC: 0.2 % (ref 0.0–0.2)

## 2023-04-11 MED ORDER — HEPARIN SOD (PORK) LOCK FLUSH 100 UNIT/ML IV SOLN
INTRAVENOUS | Status: AC
Start: 2023-04-11 — End: ?
  Filled 2023-04-11: qty 5

## 2023-04-11 MED ORDER — MIDAZOLAM HCL 2 MG/2ML IJ SOLN
INTRAMUSCULAR | Status: AC
  Filled 2023-04-11: qty 4

## 2023-04-11 MED ORDER — SODIUM CHLORIDE 0.9% FLUSH: 3.0000 mL | Freq: Two times a day (BID) | INTRAVENOUS | Status: DC

## 2023-04-11 MED ORDER — HEPARIN SOD (PORK) LOCK FLUSH 100 UNIT/ML IV SOLN
INTRAVENOUS | Status: AC
  Filled 2023-04-11: qty 5

## 2023-04-11 MED ORDER — FENTANYL CITRATE (PF) 100 MCG/2ML IJ SOLN
INTRAMUSCULAR | Status: AC | PRN
  Administered 2023-04-11: 50 ug via INTRAVENOUS
  Administered 2023-04-11 (×2): 25 ug via INTRAVENOUS

## 2023-04-11 MED ORDER — FENTANYL CITRATE (PF) 100 MCG/2ML IJ SOLN
INTRAMUSCULAR | Status: DC
Start: 2023-04-11 — End: 2023-04-11
  Filled 2023-04-11: qty 2

## 2023-04-11 MED ORDER — SODIUM CHLORIDE 0.9% FLUSH: 3.0000 mL | INTRAVENOUS | Status: DC | PRN

## 2023-04-11 MED ORDER — LIDOCAINE 1 % OPTIME INJ - NO CHARGE
10.0000 mL | Freq: Once | INTRAMUSCULAR | Status: AC
  Administered 2023-04-11: 10 mL via SUBCUTANEOUS
  Filled 2023-04-11: qty 10

## 2023-04-11 MED ORDER — MIDAZOLAM HCL 2 MG/2ML IJ SOLN
INTRAMUSCULAR | Status: AC | PRN
  Administered 2023-04-11 (×2): 1 mg via INTRAVENOUS

## 2023-04-11 NOTE — Procedures (Signed)
 Interventional Radiology Procedure Note  Procedure: CT guided aspirate and core biopsy of right posterior iliac bone Complications: None Recommendations: - Bedrest supine x 1 hrs - OTC's PRN  Pain - Follow biopsy results  Signed,  Yvone Neu. Loreta Ave, DO

## 2023-04-13 LAB — SURGICAL PATHOLOGY

## 2023-04-18 ENCOUNTER — Encounter: Payer: Self-pay | Admitting: Oncology

## 2023-04-18 ENCOUNTER — Encounter (HOSPITAL_COMMUNITY): Payer: Self-pay | Admitting: Oncology

## 2023-04-18 ENCOUNTER — Inpatient Hospital Stay (HOSPITAL_BASED_OUTPATIENT_CLINIC_OR_DEPARTMENT_OTHER): Admitting: Oncology

## 2023-04-18 VITALS — BP 133/95 | HR 75 | Temp 97.3°F | Resp 18 | Wt 199.7 lb

## 2023-04-18 DIAGNOSIS — E611 Iron deficiency: Secondary | ICD-10-CM | POA: Diagnosis not present

## 2023-04-18 DIAGNOSIS — D471 Chronic myeloproliferative disease: Secondary | ICD-10-CM | POA: Diagnosis not present

## 2023-04-18 NOTE — Assessment & Plan Note (Addendum)
 [name] 2 positive MPN age<65, no previous thrombosis events. No pre-treatment bone marrow biopsy in the past. 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  Labs are reviewed and discussed with patient. Lab Results  Component Value Date   HGB 14.7 04/11/2023   PLT 875 (H) 04/11/2023    Patient reports being compliant with hydroxyurea regimen 1000 mg alternate with 500mg  daily.  Recent increased platelet count to 1158,000, possibly due to inflammation process.  repeat cbc showed slight improvement of platelet count to 800s. He may resume Aspirin 81mg  daily.  04/11/2023 Bone marrow biopsy results were reviewed with patient.  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 Normal cytogenetics.  Awaiting for NexGen myeloid sequencing  I have sent patient to establish care with [facility] oncology for second opinion and also review of bone marrow biopsy results.   Patient has no constitutional symptoms.  Had a history of splenectomy.  Platelet count has decreased to 800,000. For now I will keep patient on the same dosage of hydroxyurea.   We discussed in details and all questions were answered to his satisfaction.

## 2023-04-18 NOTE — Assessment & Plan Note (Signed)
 Chronic persistent iron deficiency.  His MPN may contributes to iron deficiency, however his Hemoglobin has been stable, no erythrocytosis.  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  Follow up with GI
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
## 2023-04-19 ENCOUNTER — Telehealth: Payer: Self-pay

## 2023-04-19 NOTE — Telephone Encounter (Signed)
-----   Message from [name] sent at 04/18/2023  9:00 PM EDT ----- Please add NexGen myeloid sequencing on his recent bone marrow biopsy Thanks.

## 2023-04-19 NOTE — Telephone Encounter (Signed)
 Spoke to [place] at [facility] lab to request testing addon.   Ph: [phone]

## 2023-04-20 ENCOUNTER — Telehealth: Payer: Self-pay | Admitting: *Deleted

## 2023-04-20 NOTE — Telephone Encounter (Signed)
 Tresa Endo called the patient about referral and does not know what he has and what to do and why does he have to go to [facility]. She would like a call back before she will call him again because seems out of the loop of all of this from a patient side

## 2023-04-20 NOTE — Telephone Encounter (Signed)
 Tresa Endo from [facility] and her phone number [phone] and call her back when pt gets awareof why he is going here and for what dx.

## 2023-04-20 NOTE — Telephone Encounter (Signed)
 Spoke to pt and he states that he has not talked to anyone at [facility]. He has been waiting for a call. Will provide him with [facility] number so he can call her directly.

## 2023-05-02 ENCOUNTER — Encounter (HOSPITAL_COMMUNITY): Payer: Self-pay | Admitting: Oncology

## 2023-05-16 ENCOUNTER — Inpatient Hospital Stay

## 2023-05-16 ENCOUNTER — Inpatient Hospital Stay: Admitting: Oncology

## 2023-05-25 ENCOUNTER — Inpatient Hospital Stay: Attending: Oncology

## 2023-05-25 ENCOUNTER — Inpatient Hospital Stay (HOSPITAL_BASED_OUTPATIENT_CLINIC_OR_DEPARTMENT_OTHER): Admitting: Oncology

## 2023-05-25 ENCOUNTER — Encounter: Payer: Self-pay | Admitting: Oncology

## 2023-05-25 VITALS — BP 150/84 | HR 77 | Temp 97.5°F | Resp 15 | Wt 202.0 lb

## 2023-05-25 DIAGNOSIS — E611 Iron deficiency: Secondary | ICD-10-CM | POA: Insufficient documentation

## 2023-05-25 DIAGNOSIS — D75839 Thrombocytosis, unspecified: Secondary | ICD-10-CM

## 2023-05-25 DIAGNOSIS — Z9081 Acquired absence of spleen: Secondary | ICD-10-CM | POA: Insufficient documentation

## 2023-05-25 DIAGNOSIS — D471 Chronic myeloproliferative disease: Secondary | ICD-10-CM

## 2023-05-25 LAB — CBC WITH DIFFERENTIAL (CANCER CENTER ONLY)
Abs Immature Granulocytes: 0.09 10*3/uL — ABNORMAL HIGH (ref 0.00–0.07)
Basophils Absolute: 0.3 10*3/uL — ABNORMAL HIGH (ref 0.0–0.1)
Basophils Relative: 2 %
Eosinophils Absolute: 0.3 10*3/uL (ref 0.0–0.5)
Eosinophils Relative: 2 %
HCT: 43.9 % (ref 39.0–52.0)
Hemoglobin: 14 g/dL (ref 13.0–17.0)
Immature Granulocytes: 1 %
Lymphocytes Relative: 11 %
Lymphs Abs: 1.6 10*3/uL (ref 0.7–4.0)
MCH: 25.6 pg — ABNORMAL LOW (ref 26.0–34.0)
MCHC: 31.9 g/dL (ref 30.0–36.0)
MCV: 80.4 fL (ref 80.0–100.0)
Monocytes Absolute: 1.1 10*3/uL — ABNORMAL HIGH (ref 0.1–1.0)
Monocytes Relative: 8 %
Neutro Abs: 11 10*3/uL — ABNORMAL HIGH (ref 1.7–7.7)
Neutrophils Relative %: 76 %
Platelet Count: 503 10*3/uL — ABNORMAL HIGH (ref 150–400)
RBC: 5.46 MIL/uL (ref 4.22–5.81)
RDW: 21.7 % — ABNORMAL HIGH (ref 11.5–15.5)
WBC Count: 14.4 10*3/uL — ABNORMAL HIGH (ref 4.0–10.5)
nRBC: 0 % (ref 0.0–0.2)

## 2023-05-25 LAB — CMP (CANCER CENTER ONLY)
ALT: 14 U/L (ref 0–44)
AST: 21 U/L (ref 15–41)
Albumin: 3.8 g/dL (ref 3.5–5.0)
Alkaline Phosphatase: 95 U/L (ref 38–126)
Anion gap: 10 (ref 5–15)
BUN: 19 mg/dL (ref 6–20)
CO2: 22 mmol/L (ref 22–32)
Calcium: 8.6 mg/dL — ABNORMAL LOW (ref 8.9–10.3)
Chloride: 106 mmol/L (ref 98–111)
Creatinine: 0.85 mg/dL (ref 0.61–1.24)
GFR, Estimated: >60 mL/min (ref >60)
Glucose, Bld: 96 mg/dL (ref 70–99)
Potassium: 3.8 mmol/L (ref 3.5–5.1)
Sodium: 138 mmol/L (ref 135–145)
Total Bilirubin: 0.6 mg/dL (ref 0.0–1.2)
Total Protein: 7.5 g/dL (ref 6.5–8.1)

## 2023-05-25 NOTE — Assessment & Plan Note (Signed)
 Chronic persistent iron deficiency.  His MPN may contributes to iron deficiency, however his Hemoglobin has been stable, no erythrocytosis.  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  Follow up with GI
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
## 2023-05-25 NOTE — Assessment & Plan Note (Addendum)
 [name] 2 positive MPN age<65, no previous thrombosis events. No pre-treatment bone marrow biopsy in the past. 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  Labs are reviewed and discussed with patient. Lab Results  Component Value Date   HGB 14.0 05/25/2023   PLT 503 (H) 05/25/2023    Patient reports being compliant with hydroxyurea  regimen 1000 mg alternate with 500mg  daily.  Recent increased platelet count to 1158,000, possibly due to inflammation process.  repeat cbc showed slight improvement of platelet count to 800s. He may resume Aspirin  81mg  daily.  04/11/2023 Bone marrow biopsy results were reviewed with patient.  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 Normal cytogenetics.  Myeloid NGS showed [name]2 V617F mutation without other pathological mutations. Patient has establish care with [facility] oncology for second opinion and there is likely plan to switch to Jakafi. He has upcoming virtual visit session with [facility] oncology to finalize the plan.  Will fax NGS result to [facility]. Recommend patient to stay on current dosage of hydroxyurea . He can call our office to set up follow-up appointments once he has switched to Jakafi.

## 2023-05-25 NOTE — Assessment & Plan Note (Signed)
 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

## 2023-06-05 ENCOUNTER — Other Ambulatory Visit: Payer: Self-pay | Admitting: Physician Assistant

## 2023-06-05 DIAGNOSIS — I10 Essential (primary) hypertension: Secondary | ICD-10-CM

## 2023-06-14 ENCOUNTER — Ambulatory Visit: Payer: Self-pay

## 2023-06-14 VITALS — BP 150/100 | Ht 73.0 in | Wt 203.9 lb

## 2023-06-14 DIAGNOSIS — Z0000 Encounter for general adult medical examination without abnormal findings: Secondary | ICD-10-CM
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
## 2023-06-14 NOTE — Patient Instructions (Signed)
 Kenneth [name] , Thank you for taking time out of your busy schedule to complete your Annual Wellness Visit with me. I enjoyed our conversation and look forward to speaking with you again next year. I, as well as your care team,  appreciate your ongoing commitment to your health goals. Please review the following plan we discussed and let me know if I can assist you in the future.   Follow up Visits: Next Medicare AWV with our clinical staff: 06/20/24 @ 1:10 PM IN PERSON   Have you seen your provider in the last 6 months (3 months if uncontrolled diabetes)? Yes   Clinician Recommendations:  Aim for 30 minutes of exercise or brisk walking, 6-8 glasses of water, and 5 servings of fruits and vegetables each day. TAKE CARE!      This is a list of the screening recommended for you and due dates:  Health Maintenance  Topic Date Due   COVID-19 Vaccine (1) Never done   Hepatitis C Screening  Never done   Pneumococcal Vaccination (1 of 2 - PCV) Never done   Zoster (Shingles) Vaccine (1 of 2) Never done   DTaP/Tdap/Td vaccine (2 - Td or Tdap) 01/18/2022   Flu Shot  08/19/2023   Medicare Annual Wellness Visit  06/13/2024   Colon Cancer Screening  08/20/2028   HIV Screening  Completed   HPV Vaccine  Aged Out   Meningitis B Vaccine  Aged Out    Advanced directives: (In Chart) A copy of your advanced directives are scanned into your chart should your provider ever need it. Advance Care Planning is important because it:  [x]  Makes sure you receive the medical care that is consistent with your values, goals, and preferences  [x]  It provides guidance to your family and loved ones and reduces their decisional burden about whether or not they are making the right decisions based on your wishes.  Follow the link provided in your after visit summary or read over the paperwork we have mailed to you to help you started getting your Advance Directives in place. If you need assistance in completing these, please  reach out to us  so that we can help you!

## 2023-06-22 ENCOUNTER — Other Ambulatory Visit: Payer: Self-pay | Admitting: Family Medicine

## 2023-06-22 DIAGNOSIS — I10 Essential (primary) hypertension: Secondary | ICD-10-CM

## 2023-06-22 MED ORDER — LOSARTAN POTASSIUM 25 MG PO TABS: 25.0000 mg | ORAL_TABLET | Freq: Every day | ORAL | 0 refills | Status: DC

## 2023-06-22 MED ORDER — MINOXIDIL 2.5 MG PO TABS
2.5000 mg | ORAL_TABLET | Freq: Every day | ORAL | 0 refills | Status: DC
Start: 2023-06-22 — End: 2023-08-19

## 2023-06-22 NOTE — Telephone Encounter (Signed)
 Received message from Nurse Health advisor requesting refills for losartan  25mg  daily and minoxidil  2.5mg  daily. Refills sent to pharmacy for 60 day supply.   Pt is scheduled for OV on 07/26/23 to establish care with new PCP

## 2023-07-19 ENCOUNTER — Other Ambulatory Visit: Payer: Self-pay | Admitting: Family Medicine

## 2023-07-19 DIAGNOSIS — I10 Essential (primary) hypertension: Secondary | ICD-10-CM

## 2023-07-26 ENCOUNTER — Ambulatory Visit: Admitting: Family Medicine

## 2023-07-26 ENCOUNTER — Encounter: Payer: Self-pay | Admitting: Family Medicine

## 2023-07-26 VITALS — BP 111/85 | HR 92 | Ht 73.0 in | Wt 194.0 lb

## 2023-07-26 DIAGNOSIS — I10 Essential (primary) hypertension: Secondary | ICD-10-CM

## 2023-07-26 DIAGNOSIS — Z0000 Encounter for general adult medical examination without abnormal findings: Secondary | ICD-10-CM

## 2023-07-26 DIAGNOSIS — R7989 Other specified abnormal findings of blood chemistry: Secondary | ICD-10-CM

## 2023-07-26 DIAGNOSIS — Z1159 Encounter for screening for other viral diseases: Secondary | ICD-10-CM

## 2023-07-26 DIAGNOSIS — E291 Testicular hypofunction: Secondary | ICD-10-CM

## 2023-07-26 DIAGNOSIS — G4709 Other insomnia: Secondary | ICD-10-CM

## 2023-07-26 DIAGNOSIS — D75839 Thrombocytosis, unspecified: Secondary | ICD-10-CM

## 2023-07-26 DIAGNOSIS — E7800 Pure hypercholesterolemia, unspecified: Secondary | ICD-10-CM

## 2023-07-26 DIAGNOSIS — E559 Vitamin D deficiency, unspecified: Secondary | ICD-10-CM

## 2023-07-26 DIAGNOSIS — E611 Iron deficiency: Secondary | ICD-10-CM

## 2023-07-26 DIAGNOSIS — M21372 Foot drop, left foot: Secondary | ICD-10-CM

## 2023-07-26 DIAGNOSIS — R269 Unspecified abnormalities of gait and mobility: Secondary | ICD-10-CM

## 2023-07-26 DIAGNOSIS — Z131 Encounter for screening for diabetes mellitus: Secondary | ICD-10-CM
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
## 2023-07-28 ENCOUNTER — Ambulatory Visit: Payer: Self-pay | Admitting: Family Medicine

## 2023-07-28 LAB — HEPATITIS C ANTIBODY: Hep C Virus Ab: NONREACTIVE

## 2023-07-28 LAB — LIPID PANEL
Chol/HDL Ratio: 4.8 ratio (ref 0.0–5.0)
Cholesterol, Total: 222 mg/dL — ABNORMAL HIGH (ref 100–199)
HDL: 46 mg/dL (ref >39)
LDL Chol Calc (NIH): 158 mg/dL — ABNORMAL HIGH (ref 0–99)
Triglycerides: 102 mg/dL (ref 0–149)
VLDL Cholesterol Cal: 18 mg/dL (ref 5–40)

## 2023-07-28 LAB — HEMOGLOBIN A1C
Est. average glucose Bld gHb Est-mCnc: 105 mg/dL
Hgb A1c MFr Bld: 5.3 % (ref 4.8–5.6)

## 2023-07-28 LAB — VITAMIN D 25 HYDROXY (VIT D DEFICIENCY, FRACTURES): Vit D, 25-Hydroxy: 34.1 ng/mL (ref 30.0–100.0)

## 2023-07-28 LAB — TESTOSTERONE: Testosterone: 260 ng/dL — ABNORMAL LOW (ref 264–916)

## 2023-08-03 ENCOUNTER — Other Ambulatory Visit: Payer: Self-pay

## 2023-08-03 ENCOUNTER — Ambulatory Visit: Attending: Family Medicine | Admitting: Physical Therapy

## 2023-08-03 ENCOUNTER — Encounter: Payer: Self-pay | Admitting: Physical Therapy

## 2023-08-03 DIAGNOSIS — R279 Unspecified lack of coordination: Secondary | ICD-10-CM | POA: Diagnosis present

## 2023-08-03 DIAGNOSIS — R269 Unspecified abnormalities of gait and mobility: Secondary | ICD-10-CM | POA: Insufficient documentation

## 2023-08-03 DIAGNOSIS — M6281 Muscle weakness (generalized): Secondary | ICD-10-CM | POA: Insufficient documentation

## 2023-08-03 DIAGNOSIS — R2689 Other abnormalities of gait and mobility: Secondary | ICD-10-CM | POA: Diagnosis present

## 2023-08-03 DIAGNOSIS — M21372 Foot drop, left foot: Secondary | ICD-10-CM | POA: Insufficient documentation
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
## 2023-08-09 ENCOUNTER — Ambulatory Visit: Admitting: Rehabilitative and Restorative Service Providers"

## 2023-08-09 ENCOUNTER — Encounter: Payer: Self-pay | Admitting: Rehabilitative and Restorative Service Providers"

## 2023-08-09 DIAGNOSIS — R2689 Other abnormalities of gait and mobility: Secondary | ICD-10-CM | POA: Diagnosis not present

## 2023-08-09 DIAGNOSIS — R279 Unspecified lack of coordination: Secondary | ICD-10-CM

## 2023-08-09 DIAGNOSIS — M6281 Muscle weakness (generalized): Secondary | ICD-10-CM
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
## 2023-08-17 ENCOUNTER — Ambulatory Visit: Admitting: Physical Therapy

## 2023-08-17 ENCOUNTER — Telehealth: Payer: Self-pay | Admitting: Physical Therapy

## 2023-08-17 NOTE — Telephone Encounter (Signed)
 Pt missed PT appointment on 08/17/23.  PT left a VM asking for a call back.  Lunabelle Oatley, PT 08/17/23 1:36 PM
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
## 2023-08-17 NOTE — Telephone Encounter (Signed)
 Pt was a no show for PT appointment on 08/17/23 at 12:30pm.  PT left a VM requesting a call back.  Aiden Rao, PT 08/17/23 1:17 PM

## 2023-08-18 ENCOUNTER — Other Ambulatory Visit: Payer: Self-pay | Admitting: Family Medicine

## 2023-08-18 DIAGNOSIS — I10 Essential (primary) hypertension: Secondary | ICD-10-CM

## 2023-08-18 NOTE — Addendum Note (Signed)
 Addended by: SIMMONS-ROBINSON, Calvin Chura L on: 08/18/2023 07:09 AM   Modules accepted: Level of Service

## 2023-08-23 ENCOUNTER — Ambulatory Visit: Admitting: Physical Therapy

## 2023-08-24 ENCOUNTER — Encounter: Admitting: Physical Therapy

## 2023-08-29 ENCOUNTER — Encounter: Admitting: Physical Therapy

## 2023-08-31 ENCOUNTER — Encounter: Admitting: Physical Therapy

## 2023-09-05 ENCOUNTER — Encounter: Admitting: Physical Therapy

## 2023-09-07 ENCOUNTER — Encounter: Admitting: Physical Therapy

## 2023-09-12 ENCOUNTER — Encounter: Admitting: Physical Therapy

## 2023-09-14 ENCOUNTER — Encounter: Admitting: Physical Therapy

## 2023-09-20 ENCOUNTER — Encounter: Admitting: Rehabilitative and Restorative Service Providers"

## 2023-09-23 ENCOUNTER — Encounter: Admitting: Rehabilitative and Restorative Service Providers"

## 2023-09-28 ENCOUNTER — Encounter: Admitting: Physical Therapy

## 2024-01-05 ENCOUNTER — Encounter: Payer: Self-pay | Admitting: Oncology

## 2024-01-05 ENCOUNTER — Other Ambulatory Visit: Payer: Self-pay | Admitting: Oncology

## 2024-01-05 ENCOUNTER — Other Ambulatory Visit: Payer: Self-pay

## 2024-01-05 DIAGNOSIS — D471 Chronic myeloproliferative disease: Secondary | ICD-10-CM

## 2024-01-06 ENCOUNTER — Other Ambulatory Visit: Payer: Self-pay

## 2024-01-06 ENCOUNTER — Inpatient Hospital Stay: Attending: Oncology

## 2024-01-06 ENCOUNTER — Inpatient Hospital Stay: Admitting: Oncology

## 2024-01-06 ENCOUNTER — Encounter: Payer: Self-pay | Admitting: Oncology

## 2024-01-06 VITALS — BP 132/100 | HR 97 | Temp 97.7°F | Resp 18 | Wt 208.2 lb

## 2024-01-06 DIAGNOSIS — G4700 Insomnia, unspecified: Secondary | ICD-10-CM | POA: Diagnosis not present

## 2024-01-06 DIAGNOSIS — K5900 Constipation, unspecified: Secondary | ICD-10-CM | POA: Insufficient documentation

## 2024-01-06 DIAGNOSIS — D45 Polycythemia vera: Secondary | ICD-10-CM | POA: Insufficient documentation

## 2024-01-06 DIAGNOSIS — K5903 Drug induced constipation: Secondary | ICD-10-CM

## 2024-01-06 DIAGNOSIS — D471 Chronic myeloproliferative disease: Secondary | ICD-10-CM

## 2024-01-06 DIAGNOSIS — E611 Iron deficiency: Secondary | ICD-10-CM

## 2024-01-06 DIAGNOSIS — D75839 Thrombocytosis, unspecified: Secondary | ICD-10-CM | POA: Insufficient documentation

## 2024-01-06 DIAGNOSIS — G4709 Other insomnia: Secondary | ICD-10-CM | POA: Diagnosis not present

## 2024-01-06 LAB — CMP (CANCER CENTER ONLY)
ALT: 15 U/L (ref 0–44)
AST: 25 U/L (ref 15–41)
Albumin: 4.2 g/dL (ref 3.5–5.0)
Alkaline Phosphatase: 142 U/L — ABNORMAL HIGH (ref 38–126)
Anion gap: 12 (ref 5–15)
BUN: 12 mg/dL (ref 8–23)
CO2: 24 mmol/L (ref 22–32)
Calcium: 9.1 mg/dL (ref 8.9–10.3)
Chloride: 103 mmol/L (ref 98–111)
Creatinine: 0.85 mg/dL (ref 0.61–1.24)
GFR, Estimated: >60 mL/min
Glucose, Bld: 87 mg/dL (ref 70–99)
Potassium: 3.9 mmol/L (ref 3.5–5.1)
Sodium: 139 mmol/L (ref 135–145)
Total Bilirubin: 0.7 mg/dL (ref 0.0–1.2)
Total Protein: 7.6 g/dL (ref 6.5–8.1)

## 2024-01-06 LAB — CBC WITH DIFFERENTIAL (CANCER CENTER ONLY)
Abs Immature Granulocytes: 0.15 K/uL — ABNORMAL HIGH (ref 0.00–0.07)
Basophils Absolute: 0.4 K/uL — ABNORMAL HIGH (ref 0.0–0.1)
Basophils Relative: 2 %
Eosinophils Absolute: 0.2 K/uL (ref 0.0–0.5)
Eosinophils Relative: 1 %
HCT: 43.5 % (ref 39.0–52.0)
Hemoglobin: 13.9 g/dL (ref 13.0–17.0)
Immature Granulocytes: 1 %
Lymphocytes Relative: 11 %
Lymphs Abs: 1.7 K/uL (ref 0.7–4.0)
MCH: 25.3 pg — ABNORMAL LOW (ref 26.0–34.0)
MCHC: 32 g/dL (ref 30.0–36.0)
MCV: 79.2 fL — ABNORMAL LOW (ref 80.0–100.0)
Monocytes Absolute: 1.2 K/uL — ABNORMAL HIGH (ref 0.1–1.0)
Monocytes Relative: 7 %
Neutro Abs: 12.1 K/uL — ABNORMAL HIGH (ref 1.7–7.7)
Neutrophils Relative %: 78 %
Platelet Count: 535 K/uL — ABNORMAL HIGH (ref 150–400)
RBC: 5.49 MIL/uL (ref 4.22–5.81)
RDW: 20.2 % — ABNORMAL HIGH (ref 11.5–15.5)
WBC Count: 15.7 K/uL — ABNORMAL HIGH (ref 4.0–10.5)
nRBC: 0 % (ref 0.0–0.2)

## 2024-01-06 LAB — IRON AND TIBC
Iron: 19 ug/dL — ABNORMAL LOW (ref 45–182)
Saturation Ratios: 5 % — ABNORMAL LOW (ref 17.9–39.5)
TIBC: 400 ug/dL (ref 250–450)
UIBC: 382 ug/dL

## 2024-01-06 LAB — FERRITIN: Ferritin: 18 ng/mL — ABNORMAL LOW (ref 24–336)

## 2024-01-06 MED ORDER — HYDROXYUREA 500 MG PO CAPS: 500.0000 mg | ORAL_CAPSULE | ORAL | 1 refills | Status: DC

## 2024-01-06 MED ORDER — DOCUSATE SODIUM 100 MG PO CAPS: 100.0000 mg | ORAL_CAPSULE | Freq: Two times a day (BID) | ORAL | 1 refills | Status: AC

## 2024-01-06 NOTE — Assessment & Plan Note (Signed)
 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

## 2024-01-06 NOTE — Assessment & Plan Note (Signed)
 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 No pre-treatment bone marrow biopsy in the past. 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  04/11/2023 Bone marrow biopsy results were reviewed with patient.  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 Normal cytogenetics.  Myeloid NGS showed [name]2 V617F mutation without other pathological mutations. Labs are reviewed and discussed with patient. Lab Results  Component Value Date   HGB 13.9 01/06/2024   PLT 535 (H) 01/06/2024    Second opinion at [facility] oncology was reviewed. It was discussed that hydroxyurea  is not ideal.  Jakafi is a reasonable choice for patient.  Patient did not want to switch treatment so hydroxyurea  was continued.  Lab results reviewed and discussed patient.  Stable hematocrit and platelet count.  Goal of platelet count is less than 600.  Hematocrit less than 45. Recommend patient to stay on current dosage of hydroxyurea .1000 mg on Monday, Wednesday, Friday, and Saturday, and 500 mg on Tuesday, Thursday, and Sunday

## 2024-01-06 NOTE — Assessment & Plan Note (Signed)
 Recommend patient to take stool softener Colace 100 mg twice daily.  Add MiraLAX daily as needed.

## 2024-01-06 NOTE — Assessment & Plan Note (Signed)
 Chronic persistent iron deficiency.  His MPN may contributes to iron deficiency, however his Hemoglobin has been stable, no erythrocytosis.  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  Follow up with GI Currently he is not taking iron supplementation.

## 2024-01-06 NOTE — Assessment & Plan Note (Signed)
 Recommend melatonin 5 to 10 mg at bedtime as needed. Behavior modification was reviewed and discussed with patient.
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
## 2024-01-14 ENCOUNTER — Other Ambulatory Visit: Payer: Self-pay | Admitting: Family Medicine

## 2024-01-14 DIAGNOSIS — I10 Essential (primary) hypertension: Secondary | ICD-10-CM

## 2024-01-29 ENCOUNTER — Other Ambulatory Visit: Payer: Self-pay | Admitting: Family Medicine

## 2024-01-29 ENCOUNTER — Other Ambulatory Visit: Payer: Self-pay | Admitting: Oncology

## 2024-01-29 DIAGNOSIS — I10 Essential (primary) hypertension: Secondary | ICD-10-CM

## 2024-02-02 ENCOUNTER — Encounter: Payer: Self-pay | Admitting: Oncology

## 2024-03-12 ENCOUNTER — Inpatient Hospital Stay

## 2024-03-12 ENCOUNTER — Inpatient Hospital Stay: Admitting: Oncology

## 2024-06-20 ENCOUNTER — Ambulatory Visit

## 2024-07-26 ENCOUNTER — Ambulatory Visit: Admitting: Family Medicine
